# Patient Record
Sex: Male | Born: 1985 | Race: White | Hispanic: No | Marital: Single | Smoking: Current every day smoker
Health system: Southern US, Community
[De-identification: ages and names within clinical notes are randomized; demographics above are authoritative.]

## PROBLEM LIST (undated history)

## (undated) DIAGNOSIS — I10 Essential (primary) hypertension: Secondary | ICD-10-CM

## (undated) DIAGNOSIS — B1920 Unspecified viral hepatitis C without hepatic coma: Secondary | ICD-10-CM

## (undated) HISTORY — PX: APPENDECTOMY: SHX54

---

## 2008-07-29 ENCOUNTER — Emergency Department (HOSPITAL_COMMUNITY): Admission: EM | Admit: 2008-07-29 | Discharge: 2008-07-29 | Payer: Self-pay | Admitting: Family Medicine

## 2008-08-09 ENCOUNTER — Ambulatory Visit: Payer: Self-pay | Admitting: Family Medicine

## 2008-08-09 DIAGNOSIS — M25519 Pain in unspecified shoulder: Secondary | ICD-10-CM | POA: Insufficient documentation

## 2008-08-11 ENCOUNTER — Encounter: Payer: Self-pay | Admitting: Family Medicine

## 2008-08-17 ENCOUNTER — Telehealth: Payer: Self-pay | Admitting: Family Medicine

## 2008-08-25 ENCOUNTER — Telehealth: Payer: Self-pay | Admitting: Family Medicine

## 2008-09-01 ENCOUNTER — Encounter: Payer: Self-pay | Admitting: Family Medicine

## 2008-09-06 ENCOUNTER — Telehealth: Payer: Self-pay | Admitting: Family Medicine

## 2008-09-09 ENCOUNTER — Telehealth: Payer: Self-pay | Admitting: Family Medicine

## 2008-09-09 ENCOUNTER — Encounter: Payer: Self-pay | Admitting: Family Medicine

## 2008-09-26 ENCOUNTER — Telehealth: Payer: Self-pay | Admitting: Family Medicine

## 2008-10-11 ENCOUNTER — Telehealth: Payer: Self-pay | Admitting: Family Medicine

## 2008-10-21 ENCOUNTER — Telehealth: Payer: Self-pay | Admitting: Family Medicine

## 2008-10-27 ENCOUNTER — Encounter: Payer: Self-pay | Admitting: Family Medicine

## 2011-04-08 ENCOUNTER — Emergency Department (HOSPITAL_COMMUNITY)
Admission: EM | Admit: 2011-04-08 | Discharge: 2011-04-09 | Disposition: A | Discharge status: Home / Self Care | Payer: Self-pay | Attending: Emergency Medicine | Admitting: Emergency Medicine

## 2011-04-08 ENCOUNTER — Emergency Department (HOSPITAL_COMMUNITY): Payer: Self-pay

## 2011-04-08 DIAGNOSIS — Z79899 Other long term (current) drug therapy: Secondary | ICD-10-CM | POA: Insufficient documentation

## 2011-04-08 DIAGNOSIS — IMO0002 Reserved for concepts with insufficient information to code with codable children: Secondary | ICD-10-CM | POA: Insufficient documentation

## 2012-11-15 ENCOUNTER — Emergency Department (HOSPITAL_COMMUNITY): Payer: Self-pay

## 2012-11-15 ENCOUNTER — Encounter (HOSPITAL_COMMUNITY): Payer: Self-pay | Admitting: Internal Medicine

## 2012-11-15 ENCOUNTER — Inpatient Hospital Stay (HOSPITAL_COMMUNITY)
Admission: EM | Admit: 2012-11-15 | Discharge: 2012-11-24 | DRG: 917 | Disposition: A | Discharge status: Home / Self Care | Payer: MEDICAID | Attending: Internal Medicine | Admitting: Internal Medicine

## 2012-11-15 DIAGNOSIS — J9690 Respiratory failure, unspecified, unspecified whether with hypoxia or hypercapnia: Secondary | ICD-10-CM

## 2012-11-15 DIAGNOSIS — M67919 Unspecified disorder of synovium and tendon, unspecified shoulder: Secondary | ICD-10-CM | POA: Diagnosis present

## 2012-11-15 DIAGNOSIS — I498 Other specified cardiac arrhythmias: Secondary | ICD-10-CM | POA: Diagnosis present

## 2012-11-15 DIAGNOSIS — I214 Non-ST elevation (NSTEMI) myocardial infarction: Secondary | ICD-10-CM | POA: Diagnosis present

## 2012-11-15 DIAGNOSIS — G929 Unspecified toxic encephalopathy: Secondary | ICD-10-CM | POA: Diagnosis present

## 2012-11-15 DIAGNOSIS — I420 Dilated cardiomyopathy: Secondary | ICD-10-CM | POA: Diagnosis not present

## 2012-11-15 DIAGNOSIS — J9600 Acute respiratory failure, unspecified whether with hypoxia or hypercapnia: Secondary | ICD-10-CM

## 2012-11-15 DIAGNOSIS — M719 Bursopathy, unspecified: Secondary | ICD-10-CM | POA: Diagnosis present

## 2012-11-15 DIAGNOSIS — T401X4A Poisoning by heroin, undetermined, initial encounter: Principal | ICD-10-CM | POA: Diagnosis present

## 2012-11-15 DIAGNOSIS — I5020 Unspecified systolic (congestive) heart failure: Secondary | ICD-10-CM | POA: Diagnosis present

## 2012-11-15 DIAGNOSIS — I5181 Takotsubo syndrome: Secondary | ICD-10-CM | POA: Diagnosis present

## 2012-11-15 DIAGNOSIS — E870 Hyperosmolality and hypernatremia: Secondary | ICD-10-CM | POA: Diagnosis present

## 2012-11-15 DIAGNOSIS — F19939 Other psychoactive substance use, unspecified with withdrawal, unspecified: Secondary | ICD-10-CM | POA: Diagnosis not present

## 2012-11-15 DIAGNOSIS — R34 Anuria and oliguria: Secondary | ICD-10-CM | POA: Diagnosis not present

## 2012-11-15 DIAGNOSIS — T50901A Poisoning by unspecified drugs, medicaments and biological substances, accidental (unintentional), initial encounter: Secondary | ICD-10-CM

## 2012-11-15 DIAGNOSIS — E875 Hyperkalemia: Secondary | ICD-10-CM | POA: Diagnosis present

## 2012-11-15 DIAGNOSIS — R5381 Other malaise: Secondary | ICD-10-CM | POA: Diagnosis not present

## 2012-11-15 DIAGNOSIS — G92 Toxic encephalopathy: Secondary | ICD-10-CM | POA: Diagnosis present

## 2012-11-15 DIAGNOSIS — E876 Hypokalemia: Secondary | ICD-10-CM | POA: Diagnosis not present

## 2012-11-15 DIAGNOSIS — G931 Anoxic brain damage, not elsewhere classified: Secondary | ICD-10-CM | POA: Diagnosis present

## 2012-11-15 DIAGNOSIS — B1920 Unspecified viral hepatitis C without hepatic coma: Secondary | ICD-10-CM | POA: Diagnosis present

## 2012-11-15 DIAGNOSIS — F1120 Opioid dependence, uncomplicated: Secondary | ICD-10-CM | POA: Diagnosis present

## 2012-11-15 DIAGNOSIS — N179 Acute kidney failure, unspecified: Secondary | ICD-10-CM | POA: Diagnosis present

## 2012-11-15 DIAGNOSIS — F17200 Nicotine dependence, unspecified, uncomplicated: Secondary | ICD-10-CM | POA: Diagnosis present

## 2012-11-15 DIAGNOSIS — T401X1A Poisoning by heroin, accidental (unintentional), initial encounter: Secondary | ICD-10-CM | POA: Diagnosis present

## 2012-11-15 DIAGNOSIS — R4182 Altered mental status, unspecified: Secondary | ICD-10-CM

## 2012-11-15 HISTORY — DX: Unspecified viral hepatitis C without hepatic coma: B19.20

## 2012-11-15 LAB — HEPATIC FUNCTION PANEL
ALT: 18 U/L (ref 0–53)
AST: 33 U/L (ref 0–37)
Albumin: 3.9 g/dL (ref 3.5–5.2)
Alkaline Phosphatase: 85 U/L (ref 39–117)
Bilirubin, Direct: 0.1 mg/dL (ref 0.0–0.3)
Indirect Bilirubin: 0.1 mg/dL — ABNORMAL LOW (ref 0.3–0.9)
Total Bilirubin: 0.2 mg/dL — ABNORMAL LOW (ref 0.3–1.2)
Total Protein: 7.3 g/dL (ref 6.0–8.3)

## 2012-11-15 LAB — CBC WITH DIFFERENTIAL/PLATELET
Basophils Absolute: 0 10*3/uL (ref 0.0–0.1)
Basophils Relative: 0 % (ref 0–1)
Eosinophils Absolute: 0 10*3/uL (ref 0.0–0.7)
Hemoglobin: 15.3 g/dL (ref 13.0–17.0)
MCH: 32.6 pg (ref 26.0–34.0)
MCHC: 34.5 g/dL (ref 30.0–36.0)
Monocytes Absolute: 1.3 10*3/uL — ABNORMAL HIGH (ref 0.1–1.0)
Monocytes Relative: 7 % (ref 3–12)
Neutro Abs: 15.3 10*3/uL — ABNORMAL HIGH (ref 1.7–7.7)
Neutrophils Relative %: 84 % — ABNORMAL HIGH (ref 43–77)
RDW: 11.9 % (ref 11.5–15.5)

## 2012-11-15 LAB — CREATININE, SERUM
Creatinine, Ser: 1.18 mg/dL (ref 0.50–1.35)
GFR calc non Af Amer: 83 mL/min — ABNORMAL LOW (ref >90)

## 2012-11-15 LAB — CBC
Hemoglobin: 14.6 g/dL (ref 13.0–17.0)
RBC: 4.64 MIL/uL (ref 4.22–5.81)

## 2012-11-15 LAB — URINALYSIS, ROUTINE W REFLEX MICROSCOPIC
Bilirubin Urine: NEGATIVE
Glucose, UA: 250 mg/dL — AB
Ketones, ur: NEGATIVE mg/dL
Leukocytes, UA: NEGATIVE
Nitrite: NEGATIVE
Protein, ur: NEGATIVE mg/dL
Specific Gravity, Urine: 1.018 (ref 1.005–1.030)
Urobilinogen, UA: 0.2 mg/dL (ref 0.0–1.0)
pH: 5.5 (ref 5.0–8.0)

## 2012-11-15 LAB — BASIC METABOLIC PANEL
BUN: 21 mg/dL (ref 6–23)
Creatinine, Ser: 1.36 mg/dL — ABNORMAL HIGH (ref 0.50–1.35)
GFR calc Af Amer: 81 mL/min — ABNORMAL LOW (ref >90)
GFR calc non Af Amer: 70 mL/min — ABNORMAL LOW (ref >90)
Glucose, Bld: 199 mg/dL — ABNORMAL HIGH (ref 70–99)

## 2012-11-15 LAB — POCT I-STAT 3, ART BLOOD GAS (G3+)
Acid-base deficit: 5 mmol/L — ABNORMAL HIGH (ref 0.0–2.0)
Patient temperature: 98.6
pO2, Arterial: 89 mmHg (ref 80.0–100.0)

## 2012-11-15 LAB — RAPID URINE DRUG SCREEN, HOSP PERFORMED
Amphetamines: NOT DETECTED
Barbiturates: NOT DETECTED
Benzodiazepines: NOT DETECTED
Cocaine: NOT DETECTED
Opiates: POSITIVE — AB
Tetrahydrocannabinol: NOT DETECTED

## 2012-11-15 LAB — MRSA PCR SCREENING: MRSA by PCR: NEGATIVE

## 2012-11-15 LAB — CK: Total CK: 852 U/L — ABNORMAL HIGH (ref 7–232)

## 2012-11-15 LAB — URINE MICROSCOPIC-ADD ON

## 2012-11-15 LAB — SALICYLATE LEVEL: Salicylate Lvl: <2 mg/dL — ABNORMAL LOW (ref 2.8–20.0)

## 2012-11-15 LAB — ETHANOL: Alcohol, Ethyl (B): <11 mg/dL (ref 0–11)

## 2012-11-15 LAB — OSMOLALITY: Osmolality: 291 mOsm/kg (ref 275–300)

## 2012-11-15 LAB — TROPONIN I: Troponin I: 0.63 ng/mL

## 2012-11-15 MED ORDER — ROCURONIUM BROMIDE 50 MG/5ML IV SOLN
80.0000 mg | Freq: Once | INTRAVENOUS | Status: AC
  Administered 2012-11-15: 80 mg via INTRAVENOUS
  Filled 2012-11-15: qty 8

## 2012-11-15 MED ORDER — SODIUM POLYSTYRENE SULFONATE 15 GM/60ML PO SUSP
30.0000 g | Freq: Once | ORAL | Status: AC
  Administered 2012-11-15: 30 g
  Filled 2012-11-15: qty 120

## 2012-11-15 MED ORDER — LIDOCAINE HCL (CARDIAC) 20 MG/ML IV SOLN
INTRAVENOUS | Status: AC
  Filled 2012-11-15: qty 5

## 2012-11-15 MED ORDER — DEXTROSE 50 % IV SOLN
1.0000 | Freq: Once | INTRAVENOUS | Status: AC
  Administered 2012-11-15: 50 mL via INTRAVENOUS
  Filled 2012-11-15: qty 50

## 2012-11-15 MED ORDER — ETOMIDATE 2 MG/ML IV SOLN
INTRAVENOUS | Status: AC | PRN
  Administered 2012-11-15: 20 mg via INTRAVENOUS

## 2012-11-15 MED ORDER — SUCCINYLCHOLINE CHLORIDE 20 MG/ML IJ SOLN
INTRAMUSCULAR | Status: AC
  Filled 2012-11-15: qty 1

## 2012-11-15 MED ORDER — SODIUM CHLORIDE 0.9 % IV SOLN
10.0000 ug/h | INTRAVENOUS | Status: DC
  Administered 2012-11-15: 100 ug/h via INTRAVENOUS
  Filled 2012-11-15 (×2): qty 50

## 2012-11-15 MED ORDER — SODIUM CHLORIDE 0.9 % IV SOLN: 250.0000 mL | INTRAVENOUS | Status: DC | PRN

## 2012-11-15 MED ORDER — LORAZEPAM 2 MG/ML IJ SOLN: 2.0000 mg | Freq: Once | INTRAMUSCULAR | Status: DC

## 2012-11-15 MED ORDER — SODIUM CHLORIDE 0.9 % IV SOLN: Freq: Once | INTRAVENOUS | Status: DC

## 2012-11-15 MED ORDER — ADENOSINE 6 MG/2ML IV SOLN
12.0000 mg | Freq: Once | INTRAVENOUS | Status: AC
  Administered 2012-11-15: 12 mg via INTRAVENOUS

## 2012-11-15 MED ORDER — PIPERACILLIN-TAZOBACTAM 3.375 G IVPB
3.3750 g | Freq: Three times a day (TID) | INTRAVENOUS | Status: DC
  Administered 2012-11-16 – 2012-11-19 (×10): 3.375 g via INTRAVENOUS
  Filled 2012-11-15 (×12): qty 50

## 2012-11-15 MED ORDER — SODIUM CHLORIDE 0.9 % IV SOLN
2.0000 mg/h | INTRAVENOUS | Status: DC
  Administered 2012-11-15: 2 mg/h via INTRAVENOUS
  Filled 2012-11-15: qty 10

## 2012-11-15 MED ORDER — SODIUM CHLORIDE 0.9 % IV BOLUS (SEPSIS)
1000.0000 mL | Freq: Once | INTRAVENOUS | Status: AC
  Administered 2012-11-15: 1000 mL via INTRAVENOUS

## 2012-11-15 MED ORDER — ROCURONIUM BROMIDE 50 MG/5ML IV SOLN
INTRAVENOUS | Status: AC | PRN
  Administered 2012-11-15: 80 mg via INTRAVENOUS

## 2012-11-15 MED ORDER — HEPARIN SODIUM (PORCINE) 5000 UNIT/ML IJ SOLN
5000.0000 [IU] | Freq: Three times a day (TID) | INTRAMUSCULAR | Status: DC
  Administered 2012-11-15 – 2012-11-17 (×5): 5000 [IU] via SUBCUTANEOUS
  Filled 2012-11-15 (×8): qty 1

## 2012-11-15 MED ORDER — SODIUM CHLORIDE 0.9 % IV SOLN
1.0000 g | Freq: Once | INTRAVENOUS | Status: AC
  Administered 2012-11-15: 1 g via INTRAVENOUS
  Filled 2012-11-15: qty 10

## 2012-11-15 MED ORDER — PANTOPRAZOLE SODIUM 40 MG IV SOLR
40.0000 mg | Freq: Every day | INTRAVENOUS | Status: DC
  Administered 2012-11-15 – 2012-11-18 (×4): 40 mg via INTRAVENOUS
  Filled 2012-11-15 (×5): qty 40

## 2012-11-15 MED ORDER — ROCURONIUM BROMIDE 50 MG/5ML IV SOLN
INTRAVENOUS | Status: AC
  Filled 2012-11-15: qty 2

## 2012-11-15 MED ORDER — INSULIN REGULAR HUMAN 100 UNIT/ML IJ SOLN: 10.0000 [IU] | Freq: Once | INTRAMUSCULAR | Status: DC

## 2012-11-15 MED ORDER — SODIUM CHLORIDE 0.9 % IV SOLN
INTRAVENOUS | Status: AC
  Administered 2012-11-15 – 2012-11-16 (×2): via INTRAVENOUS

## 2012-11-15 MED ORDER — PIPERACILLIN-TAZOBACTAM 3.375 G IVPB 30 MIN
3.3750 g | INTRAVENOUS | Status: AC
  Administered 2012-11-15: 3.375 g via INTRAVENOUS
  Filled 2012-11-15: qty 50

## 2012-11-15 MED ORDER — ETOMIDATE 2 MG/ML IV SOLN
INTRAVENOUS | Status: AC
  Filled 2012-11-15: qty 20

## 2012-11-15 MED ORDER — MIDAZOLAM HCL 2 MG/2ML IJ SOLN: 2.0000 mg | INTRAMUSCULAR | Status: DC | PRN

## 2012-11-15 MED ORDER — ETOMIDATE 2 MG/ML IV SOLN
20.0000 mg | Freq: Once | INTRAVENOUS | Status: AC
  Administered 2012-11-15: 20 mg via INTRAVENOUS

## 2012-11-15 MED ORDER — VANCOMYCIN HCL IN DEXTROSE 1-5 GM/200ML-% IV SOLN
1000.0000 mg | Freq: Two times a day (BID) | INTRAVENOUS | Status: DC
  Administered 2012-11-16 – 2012-11-17 (×4): 1000 mg via INTRAVENOUS
  Filled 2012-11-15 (×5): qty 200

## 2012-11-15 MED ORDER — INSULIN REGULAR HUMAN 100 UNIT/ML IJ SOLN
6.0000 [IU] | Freq: Once | INTRAMUSCULAR | Status: DC
  Filled 2012-11-15: qty 0.06

## 2012-11-15 MED ORDER — INSULIN ASPART 100 UNIT/ML ~~LOC~~ SOLN
6.0000 [IU] | Freq: Once | SUBCUTANEOUS | Status: AC
  Administered 2012-11-15: 6 [IU] via INTRAVENOUS
  Filled 2012-11-15: qty 1

## 2012-11-15 MED ORDER — SODIUM BICARBONATE 8.4 % IV SOLN
50.0000 meq | Freq: Once | INTRAVENOUS | Status: AC
  Administered 2012-11-15: 50 meq via INTRAVENOUS
  Filled 2012-11-15: qty 50

## 2012-11-15 MED ORDER — SODIUM CHLORIDE 0.9 % IV SOLN
2.0000 mg/h | INTRAVENOUS | Status: DC
  Administered 2012-11-15 (×3): 2 mg/h via INTRAVENOUS
  Filled 2012-11-15 (×2): qty 10

## 2012-11-15 MED ORDER — ADENOSINE 6 MG/2ML IV SOLN
INTRAVENOUS | Status: AC
  Administered 2012-11-15: 6 mg
  Filled 2012-11-15: qty 6

## 2012-11-15 NOTE — Code Documentation (Signed)
Radiology in room for Chest x ray

## 2012-11-15 NOTE — ED Notes (Signed)
Resting sedated on vent, breathing with vent, VSS, HR improved down to 163 from 173, 3rd IV site established, foley placed, no changes.

## 2012-11-15 NOTE — Progress Notes (Signed)
ANTIBIOTIC CONSULT NOTE - INITIAL  Pharmacy Consult for Vancomycin and Zosyn Indication: Empiric for leukocytosis and fever  Not on File  Patient Measurements: Height: 6' (182.9 cm) Weight: 179 lb 3.7 oz (81.3 kg) IBW/kg (Calculated) : 77.6  Vital Signs: Temp: 100.9 F (38.3 C) (02/23 2115) Temp src: Rectal (02/23 2002) BP: 123/88 mmHg (02/23 2115) Pulse Rate: 139 (02/23 2115) Intake/Output from previous day:   Intake/Output from this shift: Total I/O In: 1000 [I.V.:1000] Out: -   Labs:  Recent Labs  11/15/12 1839 11/15/12 2125  WBC 18.3* 17.1*  HGB 15.3 14.6  PLT 194 145*  CREATININE 1.36*  --    Estimated Creatinine Clearance: 88.8 ml/min (by C-G formula based on Cr of 1.36). No results found for this basename: VANCOTROUGH, VANCOPEAK, VANCORANDOM, GENTTROUGH, GENTPEAK, GENTRANDOM, TOBRATROUGH, TOBRAPEAK, TOBRARND, AMIKACINPEAK, AMIKACINTROU, AMIKACIN,  in the last 72 hours   Microbiology: No results found for this or any previous visit (from the past 720 hour(s)).  Medical History: Past Medical History  Diagnosis Date  . Hepatitis C     Medications:  Med Rec Pending  Assessment: 27 y.o. male presents with heroin overdose - found unresponsive, respiratory failure requiring intubation in ED. Pt with leukocytosis and fever. LA 4.6. To begin empiric Vancomycin and Zosyn. Allergies unknown. Creat 1.35 - est CrCl 85 ml/min. Blood cultures pending.  Goal of Therapy:  Vancomycin trough level 10-15 mcg/ml  Plan:  1. Zosyn 3.375gm IV q8h. First dose over 30 minutes then subsequent doses over 4 hours. 2. Vancomycin 1 gm IV q12h. First dose now. 3. Will f/u microbiological data, renal function, trough at Css  Christoper Fabian, PharmD, BCPS Clinical pharmacist, pager [phone] 11/15/2012,10:03 PM

## 2012-11-15 NOTE — ED Notes (Signed)
Foley attempted by this EMT and Wes, RN with no success.

## 2012-11-15 NOTE — ED Notes (Signed)
No changes. Kayexalate given, family at [facility], VSS/improved, RT in to suction ETT.

## 2012-11-15 NOTE — H&P (Signed)
PULMONARY  / CRITICAL CARE MEDICINE  Name: Austin Nielsen MRN: [id number] DOB: [birth year]    ADMISSION DATE:  11/15/2012 CHIEF COMPLAINT:  Heroin overdose and respiratory failure.  BRIEF PATIENT DESCRIPTION:  27 years old male with PMH relevant for hepatitis C and drug abuse. Presents brought by EMS after being found unresponsive.  SIGNIFICANT EVENTS / STUDIES:  - Normal head CT - Normal chest X ray  LINES / TUBES: - 3 peripheral IV's - Consent for central line taken in case is needed.  CULTURES: - Will order (11/15/12)  ANTIBIOTICS: - Zosyn (11/15/12) - Vancomycin (11/15/12)  HISTORY OF PRESENT ILLNESS:   27 years old male with PMH relevant for hepatitis C and drug abuse. Presents brought by EMS after being found unresponsive. He was last seen doing well today at 10:00am. He was found at about 6:30 pm. At admission he was unresponsive and was intubated for airway protection. He had pin point pupils, had some posturing movements as per the ED physicians and was tachycardic and hypertensive. There are multiple needle marks in his right arm. At the time of my exam the patient is intubated, sedated with versed, in sinus tachycardia in the 150's and slightly hypertensive. As per the nurse in the room he was moving all 4 extremities and trying to reach for the ETT with his left hand.   PAST MEDICAL HISTORY :  Past Medical History  Diagnosis Date  . Hepatitis C    No past surgical history on file. Prior to Admission medications   Not on File   Not on File  FAMILY HISTORY:  No family history on file. SOCIAL HISTORY: Current smoker of about a pack per day  REVIEW OF SYSTEMS:  Unable to obtain.  SUBJECTIVE:   VITAL SIGNS: Temp:  [100.2 F (37.9 C)-100.8 F (38.2 C)] 100.8 F (38.2 C) (02/23 2002) Pulse Rate:  [161-180] 161 (02/23 2001) Resp:  [13-60] 22 (02/23 2001) BP: (126-226)/(87-209) 126/87 mmHg (02/23 2001) SpO2:  [96 %-100 %] 100 % (02/23 2001) FiO2 (%):  [40  %] 40 % (02/23 1900) HEMODYNAMICS:   VENTILATOR SETTINGS: Vent Mode:  [-] PRVC FiO2 (%):  [40 %] 40 % Set Rate:  [20 bmp] 20 bmp PEEP:  [5 cmH20] 5 cmH20 Plateau Pressure:  [16 cmH20] 16 cmH20 INTAKE / OUTPUT: Intake/Output     02/23 0701 - 02/24 0700   I.V. 1000   Total Intake 1000   Net +1000         PHYSICAL EXAMINATION: General: Intubated, sedated, no apparent distress. Eyes: Anicteric sclerae. ENT: Oropharynx clear. Dry mucous membranes. ETT in place Lymph: No cervical, supraclavicular, or axillary lymphadenopathy. Heart: Normal S1, S2. Tachycardic. No murmurs, rubs, or gallops appreciated. No bruits, equal pulses. Lungs: Normal excursion, no dullness to percussion. Good air movement bilaterally, without wheezes or crackles. Normal upper airway sounds without evidence of stridor. Abdomen: Abdomen soft, non-tender and not distended, normoactive bowel sounds. No hepatosplenomegaly or masses. Musculoskeletal: No clubbing or synovitis. Swollen right hand but warm with good capillary filling. Normal distal pulses in all 4 extremities.  Skin: No rashes. Needle marks in the right antecubital area. Neuro: Unresponsive to verbal and painful stimuli. Slight movement of the left hand.   LABS:  Recent Labs Lab 11/15/12 1839 11/15/12 1903  HGB 15.3  --   WBC 18.3*  --   PLT 194  --   NA 134*  --   K 6.4*  --   CL 98  --  CO2 22  --   GLUCOSE 199*  --   BUN 21  --   CREATININE 1.36*  --   CALCIUM 8.5  --   MG 1.7  --   AST 33  --   ALT 18  --   ALKPHOS 85  --   BILITOT 0.2*  --   PROT 7.3  --   ALBUMIN 3.9  --   LATICACIDVEN 4.6*  --   TROPONINI 0.63*  --   PHART  --  7.313*  PCO2ART  --  40.8  PO2ART  --  89.0   No results found for this basename: GLUCAP,  in the last 168 hours  CXR:  - No acute infiltrates. ETT in adequate position.  ASSESSMENT / PLAN: 27 years old male with PMH relevant for hepatitis C and IV drug abuse (heroin). Presents brought by EMS  after being found unresponsive. Intubated for airway protection. Urine toxicology screen positive for opiates. CT scan of the head with no acute findings. Slightly elevated troponin. Hyperkalemia (treated), Acute renal failure (creat: 1.36) with mildly elevated CPK (852).   PULMONARY A: 1) Acute respiratory failure due to inability to protect his airway 2) Heroin overdose P:   - Admit to ICU - Mechanical ventilation   - PRVC, Vt: 8cc/kg, PEEP: 5, RR: 16, FiO2: 100% and adjust to keep O2 sat > 96% - VAP prevention order set - SBT in am  CARDIOVASCULAR A:  1) Sinus tachycardia 2) Hypertension  3) Elevated troponin P:  - Will monitor in ICU - Consider IV metoprolol if persistent tachycardia / hypertension after IV resuscitation. - Serial troponin - Echocardiogram in am  RENAL A:   1) Acute renal failure, likely pre renal, mildly elevated CPK. 2) Hyperkalemia P:  - NS 0.9% 1000 cc bolus (3rd) - Will continue NS 0.9% at 150 cc/hr for 12 hrs and then reassess  - Will repeat BMP at 2:00 am to check for resolution of hyperkalemia - Will repeat CPK at 2:00 am  GASTROINTESTINAL A:   1) No issues P:   - GI prophylaxis with protonix  HEMATOLOGIC A:   1) No issues P:  - Will follow CBC  INFECTIOUS A:   - No evidence of acute infection or aspiration.  - He does have elevated WBC and fever of 38.3 P:   - Will get blood cultures - Monitor fever curve - Echocardiogram in am - Will start Zosyn and vancomycin given elevated WBC, fever and current IV drug use. Will follow cultures and echo and modify according to results.  - Will get HIV antibodies  ENDOCRINE A:   1) No issues   NEUROLOGIC A:   1) Heroin overdose 2) Possible anoxic brain injury. No acute findings on head CT. P:   - Intermittent sedation with versed - Consider MRI in am if persistent unresponsiveness.    I have personally obtained a history, examined the patient, evaluated laboratory and imaging  results, formulated the assessment and plan and placed orders. CRITICAL CARE: The patient is critically ill with multiple organ systems failure and requires high complexity decision making for assessment and support, frequent evaluation and titration of therapies, application of advanced monitoring technologies and extensive interpretation of multiple databases. Critical Care Time devoted to patient care services described in this note is 60 minutes.   Overton Mam MD Pulmonary and Critical Care Medicine [facility] Pager: [phone]  11/15/2012, 8:53 PM

## 2012-11-15 NOTE — Progress Notes (Signed)
Chaplain found parents at bedside with pt in Trauma C. I provided emotional and spiritual support and prayed with them at request of pt's dad. Patient overdosed, possibly on heroin. He has history of drug abuse. Pt's parents are very disappointed because they thought until today that he had been "clean" from drugs for several months. Pt's dad expressed appreciation for chaplain's support. Pt will be going to 2100.

## 2012-11-15 NOTE — Code Documentation (Signed)
Pt intubated by Dr. Park Pope with a 7.5 ett @23  teeth.  Bilateral chest expansion, (+) colormetric change, Bilateral breath sounds.

## 2012-11-15 NOTE — ED Provider Notes (Signed)
History     CSN: [id number]  Arrival date & time 11/15/12  4098   First MD Initiated Contact with Patient 11/15/12 1837      Chief Complaint  Patient presents with  . Drug Overdose    (Consider location/radiation/quality/duration/timing/severity/associated sxs/prior treatment) Patient is a 27 y.o. male presenting with altered mental status. The history is provided by the EMS personnel. The history is limited by the absence of a caregiver and the condition of the patient. No language interpreter was used.  Altered Mental Status This is a new problem. The current episode started today. The problem occurs constantly. The problem has been unchanged. Nothing aggravates the symptoms. Treatments tried: dextrose, narcan. The treatment provided no relief.    Past Medical History  Diagnosis Date  . Hepatitis C     No past surgical history on file.  No family history on file.  History  Substance Use Topics  . Smoking status: Not on file  . Smokeless tobacco: Not on file  . Alcohol Use: Not on file      Review of Systems  Unable to perform ROS: Mental status change  Psychiatric/Behavioral: Positive for altered mental status.    Allergies  Review of patient's allergies indicates not on file.  Home Medications  No current outpatient prescriptions on file.  BP 99/71  Pulse 110  Temp(Src) 101.3 F (38.5 C) (Rectal)  Resp 16  Ht 6' (1.829 m)  Wt 179 lb 3.7 oz (81.3 kg)  BMI 24.3 kg/m2  SpO2 100%  Physical Exam  Constitutional: He is oriented to person, place, and time. He appears well-developed and well-nourished. He appears distressed.  HENT:  Head: Normocephalic and atraumatic.  Mouth/Throat: No oropharyngeal exudate.  Eyes:  2mm sluggish   Neck: Normal range of motion. Neck supple.  Cardiovascular: Regular rhythm and normal heart sounds.  Tachycardia present.  Exam reveals no gallop and no friction rub.   No murmur heard. Pulmonary/Chest: Accessory muscle usage  present. Tachypnea noted. No respiratory distress. He has no decreased breath sounds. He has no wheezes. He has rhonchi. He has no rales.  Abdominal: Soft. Bowel sounds are normal. He exhibits no distension and no mass. There is no tenderness. There is no rebound and no guarding.  Musculoskeletal: Normal range of motion. He exhibits no edema and no tenderness.  Neurological: He is alert and oriented to person, place, and time. GCS eye subscore is 4. GCS verbal subscore is 1. GCS motor subscore is 4.  Extension of arms and legs, likely decerebrate posturing.   Skin: Skin is warm. He is diaphoretic.  Psychiatric: He has a normal mood and affect.    ED Course  Procedures (including critical care time)  Labs Reviewed  URINE RAPID DRUG SCREEN (HOSP PERFORMED) - Abnormal; Notable for the following:    Opiates POSITIVE (*)    All other components within normal limits  BASIC METABOLIC PANEL - Abnormal; Notable for the following:    Sodium 134 (*)    Potassium 6.4 (*)    Glucose, Bld 199 (*)    Creatinine, Ser 1.36 (*)    GFR calc non Af Amer 70 (*)    GFR calc Af Amer 81 (*)    All other components within normal limits  CBC WITH DIFFERENTIAL - Abnormal; Notable for the following:    WBC 18.3 (*)    Neutrophils Relative 84 (*)    Neutro Abs 15.3 (*)    Lymphocytes Relative 10 (*)    Monocytes  Absolute 1.3 (*)    All other components within normal limits  HEPATIC FUNCTION PANEL - Abnormal; Notable for the following:    Total Bilirubin 0.2 (*)    Indirect Bilirubin 0.1 (*)    All other components within normal limits  SALICYLATE LEVEL - Abnormal; Notable for the following:    Salicylate Lvl <2.0 (*)    All other components within normal limits  URINALYSIS, ROUTINE W REFLEX MICROSCOPIC - Abnormal; Notable for the following:    Glucose, UA 250 (*)    Hgb urine dipstick TRACE (*)    All other components within normal limits  TROPONIN I - Abnormal; Notable for the following:    Troponin I  0.63 (*)    All other components within normal limits  LACTIC ACID, PLASMA - Abnormal; Notable for the following:    Lactic Acid, Venous 4.6 (*)    All other components within normal limits  CK - Abnormal; Notable for the following:    Total CK 852 (*)    All other components within normal limits  CBC - Abnormal; Notable for the following:    WBC 17.1 (*)    Platelets 145 (*)    All other components within normal limits  CREATININE, SERUM - Abnormal; Notable for the following:    GFR calc non Af Amer 83 (*)    All other components within normal limits  URINE MICROSCOPIC-ADD ON - Abnormal; Notable for the following:    Squamous Epithelial / LPF FEW (*)    Bacteria, UA FEW (*)    Casts HYALINE CASTS (*)    All other components within normal limits  GLUCOSE, CAPILLARY - Abnormal; Notable for the following:    Glucose-Capillary 106 (*)    All other components within normal limits  POCT I-STAT 3, BLOOD GAS (G3+) - Abnormal; Notable for the following:    pH, Arterial 7.313 (*)    Acid-base deficit 5.0 (*)    All other components within normal limits  MRSA PCR SCREENING  CULTURE, BLOOD (ROUTINE X 2)  CULTURE, BLOOD (ROUTINE X 2)  ACETAMINOPHEN LEVEL  OSMOLALITY  ETHANOL  MAGNESIUM  BLOOD GAS, ARTERIAL  BLOOD GAS, ARTERIAL  CBC  BASIC METABOLIC PANEL  PHOSPHORUS  BASIC METABOLIC PANEL  TROPONIN I  TROPONIN I  HIV ANTIBODY (ROUTINE TESTING)  CK  TROPONIN I   Ct Head Wo Contrast  11/15/2012  *RADIOLOGY REPORT*  Clinical Data: Unresponsive.  Altered mental status.  CT HEAD WITHOUT CONTRAST  Technique:  Contiguous axial images were obtained from the base of the skull through the vertex without contrast.  Comparison: None.  Findings: The brain has a normal appearance without evidence of malformation, atrophy, old or acute infarction, mass lesion, hemorrhage, hydrocephalus or significant extra-axial collection. 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  No skull fracture.  There are some mucosal inflammatory changes of the sinuses, most notable on the right maxillary sinus.  IMPRESSION: Normal appearance of the brain.  Some sinus inflammation, particularly of the right maxillary sinus.   Original Report Authenticated By: Paulina Fusi, M.D.    Dg Chest Port 1 View  11/15/2012  *RADIOLOGY REPORT*  Clinical Data: Respiratory distress.  Status post intubation.  PORTABLE CHEST - 1 VIEW  Comparison: None  Findings: The cardiomediastinal silhouette is unremarkable. An endotracheal tube is identified with tip 4.5 cm above the carina. The lungs are clear. There is no evidence of focal airspace disease, pulmonary edema, suspicious  pulmonary nodule/mass, pleural effusion, or pneumothorax. No acute bony abnormalities are identified.  IMPRESSION: Endotracheal tube placement without evidence of active cardiopulmonary disease.   Original Report Authenticated By: Harmon Pier, M.D.      1. Altered mental status   2. Respiratory failure   3. Drug overdose       MDM  6:57 PM Pt is a 27 y.o. male with pertinent PMHX of hep C, drug abuse who presents with decreased LOC and concern for drug OD. Pt found in backseat of car, unresponsive, tachycardic, tachypnea, diaphoretic, with drug paraphernalia and needle sticks to arm. Upon arrival, pt with inc WOB, HR 170-180s, RR 34, BP on mechanical cuff 226/209, but thought to be unlikely.  Pt had vomitus around mouth, no questionable withdraw from pain, likely decerebrate posturing.   Given concern for airway protection, pt intubated.  No signs of trauma on exam.  Needle sticks at [employer].  Adenosine given (6mg , then 12mg ) for tachycardia, slowed to 160's and likely sinus.  Pt given 2L NS bolus.  Brief beside echo done prior to intubation which showed no ptx, pericardial effusion, hyperdynamic cardiac activity.    After CXR confirmed ETT placement, pt taken for CT head.  No acute findings on CT, but clinically there is  concern for anoxic brain injury likely from heroin or polysubstance OD.    I have spoken to pt's family for about 10 mins with critical care fellow and explained our concern for drug abuse and possible ischemic brain injury. Family reports they thought he had been clean until today when tissue found in pt's room with spots of blood.  In the past he has abused PO narcotics, cocaine, heroin.    K elevated w/o hemolysis.  Hyper K+ treatment initiated.  Cr, CK, trop also somewhat elevated.  Will continue IVF resuscitation. Pt will be taken to ICU.     1. Altered mental status   2. Respiratory failure   3. Drug overdose      Labs and imaging considered in decision making, reviewed by myself.  Imaging interpreted by radiology. Pt care discussed with my attending, Dr. Rhunette Croft.    Toy Cookey, MD 11/16/12 0157  I saw and evaluated the patient, reviewed the resident's note and I agree with the findings and plan.  CRITICAL CARE Performed by: Derwood Kaplan   Total critical care time: 60 minutes  Critical care time was exclusive of separately billable procedures and treating other patients.  Critical care was necessary to treat or prevent imminent or life-threatening deterioration.  Critical care was time spent personally by me on the following activities: development of treatment plan with patient and/or surrogate as well as nursing, discussions with consultants, evaluation of patient's response to treatment, examination of patient, obtaining history from patient or surrogate, ordering and performing treatments and interventions, ordering and review of laboratory studies, ordering and review of radiographic studies, pulse oximetry and re-evaluation of patient's condition.  INTUBATION Performed by: Dr. Toy Cookey - under Dr. Brandy Hale direct supervision  Required items: required blood products, implants, devices, and special equipment available Patient identity confirmed:  provided demographic data and hospital-assigned identification number Time out: Immediately prior to procedure a "time out" was called to verify the correct patient, procedure, equipment, support staff and site/side marked as required.  Indications: Airway protection  Intubation method: Direct Laryngoscopy   Preoxygenation: BVM  Sedatives: 20 mg Etomidate Paralytic: 80 mg Rocoronium  Tube Size: 7.5 cuffed  Post-procedure assessment: chest rise and ETCO2 monitor Breath  sounds: equal and absent over the epigastrium Tube secured with: ETT holder Chest x-ray interpreted by radiologist and me.  Chest x-ray findings: endotracheal tube in appropriate position  Patient tolerated the procedure well with no immediate complications.  Pt comes in with cc of AMS. Pt has hx of heroine abuse. Pt had unknown duration of down time. Came in tachycardic, tachypneic, slightly hypotensive. No JVD, we did a bedside US - showed no PTX and no Tamponade. Pt noted to have extension contractures. Possible anoxic brain injury. Pt also tachycardic in the 180s. Adenosine 6 mg, and then 12 mg given - shows sinus tachycardia. Family made aware of the diagnosis and the findings. CCM admission.   Diagnosis: Altered Mental Status Substance abuse  PSVT Hyperkalemia    Derwood Kaplan, MD 11/17/12 1610

## 2012-11-15 NOTE — ED Notes (Addendum)
Per [place] with a RR of 60. Spoon with residue noted beside pt as well as needles and fresh track marks to [facility] area. Ems placed a NPA and a NRB.  4mg  of Narcan IV no response to the med.  D50 administered no change as well.  Pt foaming at the mouth he does withdraw from pain.  Vomited x 1 coffee ground emesis.  Pt arrived to ER with No change in status but noted to be posturing.

## 2012-11-15 NOTE — ED Notes (Signed)
Pt moved L hand up towards shoulder, arm b/w rail, replaced back along side, sz pad placed on L rail to protect arm, midazolam bolus given, PCCM and family x3 now at [facility].

## 2012-11-16 ENCOUNTER — Encounter (HOSPITAL_COMMUNITY): Payer: Self-pay

## 2012-11-16 DIAGNOSIS — T50901A Poisoning by unspecified drugs, medicaments and biological substances, accidental (unintentional), initial encounter: Secondary | ICD-10-CM

## 2012-11-16 DIAGNOSIS — J9600 Acute respiratory failure, unspecified whether with hypoxia or hypercapnia: Secondary | ICD-10-CM

## 2012-11-16 LAB — BLOOD GAS, ARTERIAL
Acid-base deficit: 1.1 mmol/L (ref 0.0–2.0)
Drawn by: 252031
FIO2: 0.4 %
MECHVT: 620 mL
O2 Saturation: 99.1 %
RATE: 16 resp/min
TCO2: 23.6 mmol/L (ref 0–100)
pO2, Arterial: 153 mmHg — ABNORMAL HIGH (ref 80.0–100.0)

## 2012-11-16 LAB — BASIC METABOLIC PANEL
CO2: 25 mEq/L (ref 19–32)
CO2: 25 mEq/L (ref 19–32)
Calcium: 8.4 mg/dL (ref 8.4–10.5)
Calcium: 8.4 mg/dL (ref 8.4–10.5)
Chloride: 102 mEq/L (ref 96–112)
Chloride: 104 mEq/L (ref 96–112)
GFR calc Af Amer: >90 mL/min (ref >90)
Sodium: 135 mEq/L (ref 135–145)
Sodium: 137 mEq/L (ref 135–145)

## 2012-11-16 LAB — TROPONIN I
Troponin I: 0.94 ng/mL (ref <0.30)
Troponin I: 0.85 ng/mL (ref <0.30)
Troponin I: 3.02 ng/mL (ref <0.30)

## 2012-11-16 LAB — PHOSPHORUS: Phosphorus: 1.9 mg/dL — ABNORMAL LOW (ref 2.3–4.6)

## 2012-11-16 LAB — CBC
Platelets: 143 10*3/uL — ABNORMAL LOW (ref 150–400)
RBC: 4.41 MIL/uL (ref 4.22–5.81)
WBC: 14.5 10*3/uL — ABNORMAL HIGH (ref 4.0–10.5)

## 2012-11-16 MED ORDER — PROPOFOL 10 MG/ML IV EMUL
INTRAVENOUS | Status: AC
  Filled 2012-11-16: qty 100

## 2012-11-16 MED ORDER — FENTANYL CITRATE 0.05 MG/ML IJ SOLN
50.0000 ug | INTRAMUSCULAR | Status: DC | PRN
  Administered 2012-11-16 – 2012-11-18 (×11): 100 ug via INTRAVENOUS
  Filled 2012-11-16 (×11): qty 2

## 2012-11-16 MED ORDER — PROPOFOL 10 MG/ML IV EMUL
5.0000 ug/kg/min | INTRAVENOUS | Status: DC
  Administered 2012-11-16: 40 ug/kg/min via INTRAVENOUS
  Administered 2012-11-16: 15 ug/kg/min via INTRAVENOUS
  Administered 2012-11-16: 40 ug/kg/min via INTRAVENOUS
  Administered 2012-11-17: 50 ug/kg/min via INTRAVENOUS
  Administered 2012-11-17: 40 ug/kg/min via INTRAVENOUS
  Administered 2012-11-17: 60 ug/kg/min via INTRAVENOUS
  Administered 2012-11-17: 50 ug/kg/min via INTRAVENOUS
  Administered 2012-11-17: 60 ug/kg/min via INTRAVENOUS
  Administered 2012-11-17: 40 ug/kg/min via INTRAVENOUS
  Administered 2012-11-18: 35 ug/kg/min via INTRAVENOUS
  Administered 2012-11-18: 50 ug/kg/min via INTRAVENOUS
  Filled 2012-11-16 (×11): qty 100

## 2012-11-16 MED ORDER — ACETAMINOPHEN 160 MG/5ML PO SOLN
650.0000 mg | Freq: Four times a day (QID) | ORAL | Status: DC | PRN
  Administered 2012-11-16: 650 mg
  Filled 2012-11-16 (×3): qty 20.3

## 2012-11-16 MED ORDER — POTASSIUM PHOSPHATE DIBASIC 3 MMOLE/ML IV SOLN
20.0000 mmol | Freq: Once | INTRAVENOUS | Status: AC
  Administered 2012-11-16: 20 mmol via INTRAVENOUS
  Filled 2012-11-16: qty 6.67

## 2012-11-16 NOTE — Progress Notes (Signed)
Pts sedation was turned off once more to reevaluate neuro status. While off sedation pt slowly began to move all extremities however pt was unable to follow commands. Pt opened eyes to verbal stimuli and pupils become more responsive to light. Will continue to monitor pt. Will leave pt on fent and give versed intermit. Will continue to monitor pt.

## 2012-11-16 NOTE — Progress Notes (Signed)
Pts continuous sedation turned off per doctors order to allow for better assessment of pts neuro status. Pts neuro status was reassessed 1hr post discontinuation of fent. And vers. Pt pupils are +4 and sluggish to light on right side and 5+ and sluggish to light on the left. Upon vocal stimulation and sternal rub pt appears to have decorticate posturing. Second opinion was asked of charge nurse. Charge nurse agreed that pt seemed to be posturing. Md was notified on pt neurological status. Md gave verbal order to restart sedation if pt seems to be uncomfortable and continued posturing. Will continue to monitor pt.

## 2012-11-16 NOTE — Progress Notes (Addendum)
Chaplain visited patient in response to a referral from on-call Chaplain the previous night. Pt was brought in as a result of drug overdose. Pt was unconscious and intubated. Pt's mother and older sister were in the room during Chaplain's visit. Mother expressed feelings of anger and sadness. Sister was shocked and sad as well. Chaplain listened empathically to family members as they narrate stories about pt. Chaplain shared words of comfort, encouragement, hope and provided ministry of presence. Family thanked Chaplain for his visit and presence. Kelle Darting [phone]

## 2012-11-16 NOTE — Progress Notes (Signed)
UR Completed.  Semone Orlov Jane [phone] 11/16/2012  

## 2012-11-16 NOTE — Progress Notes (Signed)
Pt has hematoma on left temporal area. Small read abrasion present

## 2012-11-16 NOTE — H&P (Signed)
PULMONARY  / CRITICAL CARE MEDICINE  Name: Austin Nielsen MRN: [id number] DOB: 03/26/1986    ADMISSION DATE:  11/15/2012 CHIEF COMPLAINT:  Heroin overdose and respiratory failure.    BRIEF PATIENT DESCRIPTION:  HISTORY OF PRESENT ILLNESS:   27 years old male with PMH relevant for hepatitis C and drug abuse. Presents brought by EMS after being found unresponsive. He was last seen doing well today at 10:00am. He was found at about 6:30 pm. At admission he was unresponsive and was intubated for airway protection. He had pin point pupils, had some posturing movements as per the ED physicians and was tachycardic and hypertensive. There are multiple needle marks in his right arm. At the time of my exam the patient is intubated, sedated with versed, in sinus tachycardia in the 150's and slightly hypertensive. As per the nurse in the room he was moving all 4 extremities and trying to reach for the ETT with his left hand.   27 years old male with PMH relevant for hepatitis C and drug abuse. Presents brought by EMS after being found unresponsive.  SIGNIFICANT EVENTS / STUDIES:  - Normal head CT - Normal chest X ray  LINES / TUBES: - 3 peripheral IV's - Consent for central line taken in case is needed.  CULTURES: - Will order (11/15/12)  ANTIBIOTICS: - Zosyn (11/15/12) - Vancomycin (11/15/12)   EVENTS 11/15/2012 > admission .  SUBJECTIVE:  11/16/2012: He seems to be gradually waking up   VITAL SIGNS: Temp:  [98.7 F (37.1 C)-101.3 F (38.5 C)] 98.8 F (37.1 C) (02/24 1200) Pulse Rate:  [83-180] 103 (02/24 1200) Resp:  [13-60] 17 (02/24 1200) BP: (99-226)/(71-209) 119/83 mmHg (02/24 1200) SpO2:  [96 %-100 %] 100 % (02/24 1200) FiO2 (%):  [40 %] 40 % (02/24 1136) Weight:  [81.3 kg (179 lb 3.7 oz)] 81.3 kg (179 lb 3.7 oz) (02/23 2124) HEMODYNAMICS:   VENTILATOR SETTINGS: Vent Mode:  [-] PRVC FiO2 (%):  [40 %] 40 % Set Rate:  [16 bmp-20 bmp] 16 bmp Vt Set:  [520 mL-620 mL]  520 mL PEEP:  [5 cmH20] 5 cmH20 Plateau Pressure:  [16 cmH20-18 cmH20] 17 cmH20 INTAKE / OUTPUT: Intake/Output     02/23 0701 - 02/24 0700 02/24 0701 - 02/25 0700   I.V. (mL/kg) 2440.6 (30) 374.6 (4.6)   IV Piggyback 12.5 37.5   Total Intake(mL/kg) 2453.1 (30.2) 412.1 (5.1)   Urine (mL/kg/hr) 1225 940 (2)   Total Output 1225 940   Net +1228.1 -527.9          PHYSICAL EXAMINATION: General: Intubated, sedated, no apparent distress. Eyes: Anicteric sclerae. ENT: Oropharynx clear. Dry mucous membranes. ETT in place Lymph: No cervical, supraclavicular, or axillary lymphadenopathy. Heart: Normal S1, S2. Tachycardic. No murmurs, rubs, or gallops appreciated. No bruits, equal pulses. Lungs: Normal excursion, no dullness to percussion. Good air movement bilaterally, without wheezes or crackles. Normal upper airway sounds without evidence of stridor. Abdomen: Abdomen soft, non-tender and not distended, normoactive bowel sounds. No hepatosplenomegaly or masses. Musculoskeletal: No clubbing or synovitis. Swollen right hand but warm with good capillary filling. Normal distal pulses in all 4 extremities.  Skin: No rashes. Needle marks in the right antecubital area. Neuro: Moving all 4 extremities, pupils equal and reactive to light, opens eyes spontaneously, random movement, gag present, mildly agitated. Does not focus   LABS:  Recent Labs Lab 11/15/12 1839 11/15/12 1903 11/15/12 2125 11/16/12 0144 11/16/12 0415 11/16/12 0439 11/16/12 0730  HGB 15.3  --  14.6  --  13.9  --   --   WBC 18.3*  --  17.1*  --  14.5*  --   --   PLT 194  --  145*  --  143*  --   --   NA 134*  --   --  135 137  --   --   K 6.4*  --   --  4.3 4.5  --   --   CL 98  --   --  102 104  --   --   CO2 22  --   --  25 25  --   --   GLUCOSE 199*  --   --  136* 115*  --   --   BUN 21  --   --  21 20  --   --   CREATININE 1.36*  --  1.18 1.04 0.98  --   --   CALCIUM 8.5  --   --  8.4 8.4  --   --   MG 1.7  --   --    --   --   --   --   PHOS  --   --   --   --  1.9*  --   --   AST 33  --   --   --   --   --   --   ALT 18  --   --   --   --   --   --   ALKPHOS 85  --   --   --   --   --   --   BILITOT 0.2*  --   --   --   --   --   --   PROT 7.3  --   --   --   --   --   --   ALBUMIN 3.9  --   --   --   --   --   --   LATICACIDVEN 4.6*  --   --   --   --   --   --   TROPONINI 0.63*  --   --  0.94*  --   --  0.85*  PHART  --  7.313*  --   --   --  7.432  --   PCO2ART  --  40.8  --   --   --  34.5*  --   PO2ART  --  89.0  --   --   --  153.0*  --     Recent Labs Lab 11/15/12 2229  GLUCAP 106*    CXR:  - No acute infiltrates. ETT in adequate position.  ASSESSMENT / PLAN: 27 years old male with PMH relevant for hepatitis C and IV drug abuse (heroin). Presents brought by EMS after being found unresponsive. Intubated for airway protection. Urine toxicology screen positive for opiates. CT scan of the head with no acute findings. Slightly elevated troponin. Hyperkalemia (treated), Acute renal failure (creat: 1.36) with mildly elevated CPK (852).   PULMONARY A: 1) Acute respiratory failure due to inability to protect his airway 2) Heroin overdose - 11/16/2012: Does not meet spontaneous breathing trial criteria due to  agitation  P:   - - Mechanical ventilation   - PRVC, Vt: 8cc/kg, PEEP: 5, RR: 16, FiO2: 100% and adjust to keep O2 sat > 96% - VAP prevention order set - SBT in am if meets criteria  CARDIOVASCULAR   Recent Labs Lab 11/15/12 1839 11/16/12 0144 11/16/12 0730  TROPONINI 0.63* 0.94* 0.85*    A:  1) Sinus tachycardia 2) Hypertension  3) Elevated troponin P:  - Will monitor in ICU - Consider IV metoprolol if persistent tachycardia / hypertension after IV resuscitation. - Echocardiogram i done and results pending    RENAL  Recent Labs Lab 11/15/12 1839 11/15/12 2125 11/16/12 0144 11/16/12 0415  NA 134*  --  135 137  K 6.4*  --  4.3 4.5  CL 98  --  102 104  CO2  22  --  25 25  GLUCOSE 199*  --  136* 115*  BUN 21  --  21 20  CREATININE 1.36* 1.18 1.04 0.98  CALCIUM 8.5  --  8.4 8.4  MG 1.7  --   --   --   PHOS  --   --   --  1.9*    A:   1) Acute renal failure, likely pre renal, mildly elevated CPK.  11/16/2012 hypophosphatemia P:  - Replace phosphorus and trac electrolytes daily   GASTROINTESTINAL  Recent Labs Lab 11/15/12 1839  AST 33  ALT 18  ALKPHOS 85  BILITOT 0.2*  PROT 7.3  ALBUMIN 3.9    A:   1) No issues P:   - GI prophylaxis with protonix  HEMATOLOGIC  Recent Labs Lab 11/15/12 1839 11/15/12 2125 11/16/12 0415  HGB 15.3 14.6 13.9  HCT 44.4 43.9 41.0  WBC 18.3* 17.1* 14.5*  PLT 194 145* 143*    A:   1) No issues P:  - Will follow CBC  INFECTIOUS  Recent Labs Lab 11/15/12 1839  LATICACIDVEN 4.6*    A:   - No evidence of acute infection or aspiration.  - He does have elevated WBC and fever of 38.3 P:   - Will get blood cultures - Monitor fever curve - Echocardiogram in am - Will start Zosyn and vancomycin given elevated WBC, fever and current IV drug use. Will follow cultures and echo and modify according to results.  - Will get HIV antibodies  ENDOCRINE A:   1) No issues   NEUROLOGIC A:   1) Heroin overdose 2) Possible anoxic brain injury. No acute findings on head CT. - 11/16/2012: Improving mental status P:   - Hold off on MRI and neurology consultation - Use propofol for sedation    Global  - 11/16/2012: Mother and sister updated. Mother  significant spiritual distress due to loss of another sibling or drug problems patient relapsing  with the same since October 2013. Chaplain consult called    The patient is critically ill with multiple organ systems failure and requires high complexity decision making for assessment and support, frequent evaluation and titration of therapies, application of advanced monitoring technologies and extensive interpretation of multiple databases.    Critical Care Time devoted to patient care services described in this note is  31  Minutes.      Dr. Kalman Shan, M.D., [facility].C.P Pulmonary and Critical Care Medicine Staff Physician [facility] Pager: [phone], If no answer or between  15:00h - 7:00h: call [phone]  11/16/2012 1:02 PM

## 2012-11-16 NOTE — Progress Notes (Signed)
  Echocardiogram 2D Echocardiogram has been performed.  Georgian Co 11/16/2012, 10:08 AM

## 2012-11-16 NOTE — Progress Notes (Signed)
eLink Physician-Brief Progress Note Patient Name: Austin Nielsen DOB: 1986/05/25 MRN: [id number]  Date of Service  11/16/2012   HPI/Events of Note   Temp of 101.45F.  LFTs WNL  eICU Interventions  Plan: Order for PRN tylenol 650 mg via tube q6 hours as needed for temp greater or equal to 100.57F   Intervention Category Minor Interventions: Routine modifications to care plan (e.g. PRN medications for pain, fever)  DETERDING,ELIZABETH 11/16/2012, 12:06 AM

## 2012-11-16 NOTE — Clinical Social Work Psychosocial (Signed)
     Clinical Social Work Department BRIEF PSYCHOSOCIAL ASSESSMENT 11/16/2012  Patient:  Austin Nielsen, Austin Nielsen     Account Number:  [id number]     Admit date:  11/15/2012  Clinical Social Worker:  Margaree Mackintosh  Date/Time:  11/16/2012 12:00 M  Referred by:  RN  Date Referred:  11/16/2012 Referred for  Substance Abuse   Other Referral:   Interview type:  Other - See comment Other interview type:   RN & MD.    PSYCHOSOCIAL DATA Living Status:  FAMILY Admitted from facility:   Level of care:   Primary support name:  Diondre Pulis: [phone] Primary support relationship to patient:  PARENT Degree of support available:   RN reports mother has been at bedside.    CURRENT CONCERNS Current Concerns  Substance Abuse   Other Concerns:    SOCIAL WORK ASSESSMENT / PLAN Clinical Social Worker recieved referral from RN indicating pt with admission related to Herioin Overdose.  CSW staffed case with RN and MD during progression.  RN reports family has been at bedside and has experienced loss within their family unit.  CSW made referral to chaplain services to provide additional support/resources to family during this hospital admission.  CSW went to pt's room-no family currently at bedside-pt currently intubated and sedated. CSW to continue to follow and assist as needed.   Assessment/plan status:  Information/Referral to [facility] Other assessment/ plan:   Information/referral to community resources:   [employer].    PATIENTS/FAMILYS RESPONSE TO PLAN OF CARE: RN and MD appeared receptive to CSW intervention.  Pt and family currently unavailable.

## 2012-11-17 ENCOUNTER — Inpatient Hospital Stay (HOSPITAL_COMMUNITY): Payer: MEDICAID

## 2012-11-17 DIAGNOSIS — I420 Dilated cardiomyopathy: Secondary | ICD-10-CM | POA: Diagnosis not present

## 2012-11-17 DIAGNOSIS — T6591XS Toxic effect of unspecified substance, accidental (unintentional), sequela: Secondary | ICD-10-CM

## 2012-11-17 DIAGNOSIS — T50901S Poisoning by unspecified drugs, medicaments and biological substances, accidental (unintentional), sequela: Secondary | ICD-10-CM

## 2012-11-17 DIAGNOSIS — R4182 Altered mental status, unspecified: Secondary | ICD-10-CM

## 2012-11-17 DIAGNOSIS — I214 Non-ST elevation (NSTEMI) myocardial infarction: Secondary | ICD-10-CM | POA: Diagnosis present

## 2012-11-17 LAB — PHOSPHORUS: Phosphorus: 1.8 mg/dL — ABNORMAL LOW (ref 2.3–4.6)

## 2012-11-17 LAB — BASIC METABOLIC PANEL
CO2: 26 mEq/L (ref 19–32)
Chloride: 107 mEq/L (ref 96–112)
Creatinine, Ser: 0.86 mg/dL (ref 0.50–1.35)
GFR calc Af Amer: >90 mL/min (ref >90)
Potassium: 3.4 mEq/L — ABNORMAL LOW (ref 3.5–5.1)

## 2012-11-17 LAB — CK TOTAL AND CKMB (NOT AT ARMC)
CK, MB: 4 ng/mL (ref 0.3–4.0)
Relative Index: 0.6 (ref 0.0–2.5)

## 2012-11-17 LAB — MAGNESIUM: Magnesium: 2 mg/dL (ref 1.5–2.5)

## 2012-11-17 LAB — LACTIC ACID, PLASMA: Lactic Acid, Venous: 0.9 mmol/L (ref 0.5–2.2)

## 2012-11-17 LAB — HEPARIN LEVEL (UNFRACTIONATED): Heparin Unfractionated: <0.1 IU/mL — ABNORMAL LOW (ref 0.30–0.70)

## 2012-11-17 MED ORDER — PRO-STAT SUGAR FREE PO LIQD
30.0000 mL | Freq: Three times a day (TID) | ORAL | Status: DC
  Administered 2012-11-17 – 2012-11-18 (×5): 30 mL
  Filled 2012-11-17 (×8): qty 30

## 2012-11-17 MED ORDER — HEPARIN (PORCINE) IN NACL 100-0.45 UNIT/ML-% IJ SOLN
1700.0000 [IU]/h | INTRAMUSCULAR | Status: DC
  Administered 2012-11-17: 1400 [IU]/h via INTRAVENOUS
  Administered 2012-11-17: 1150 [IU]/h via INTRAVENOUS
  Administered 2012-11-18: 1500 [IU]/h via INTRAVENOUS
  Administered 2012-11-19 – 2012-11-20 (×2): 1700 [IU]/h via INTRAVENOUS
  Filled 2012-11-17 (×5): qty 250

## 2012-11-17 MED ORDER — POTASSIUM PHOSPHATE DIBASIC 3 MMOLE/ML IV SOLN
30.0000 mmol | Freq: Once | INTRAVENOUS | Status: AC
  Administered 2012-11-17: 30 mmol via INTRAVENOUS
  Filled 2012-11-17: qty 10

## 2012-11-17 MED ORDER — METOPROLOL TARTRATE 1 MG/ML IV SOLN
3.0000 mg | INTRAVENOUS | Status: DC
  Administered 2012-11-17 – 2012-11-18 (×7): 3 mg via INTRAVENOUS
  Filled 2012-11-17 (×29): qty 5

## 2012-11-17 MED ORDER — HEPARIN BOLUS VIA INFUSION
2400.0000 [IU] | Freq: Once | INTRAVENOUS | Status: AC
  Administered 2012-11-17: 2400 [IU] via INTRAVENOUS
  Filled 2012-11-17: qty 2400

## 2012-11-17 MED ORDER — PROMOTE PO LIQD
1000.0000 mL | ORAL | Status: DC
  Administered 2012-11-17: 1000 mL
  Filled 2012-11-17 (×4): qty 1000

## 2012-11-17 MED ORDER — ASPIRIN 81 MG PO CHEW
81.0000 mg | CHEWABLE_TABLET | Freq: Every day | ORAL | Status: DC
  Administered 2012-11-17 – 2012-11-24 (×8): 81 mg via ORAL
  Filled 2012-11-17 (×8): qty 1

## 2012-11-17 NOTE — Progress Notes (Signed)
PULMONARY  / CRITICAL CARE MEDICINE  Name: Austin Nielsen MRN: [id number] DOB: 06/12/1986    ADMISSION DATE:  11/15/2012 CHIEF COMPLAINT:  Heroin overdose and respiratory failure.    BRIEF PATIENT DESCRIPTION:  27 years old male with PMH relevant for hepatitis C and drug abuse. Presents brought by EMS after being found unresponsive. He was last seen doing well today at 10:00am. He was found at about 6:30 pm. At admission he was unresponsive and was intubated for airway protection. He had pin point pupils, had some posturing movements as per the ED physicians and was tachycardic and hypertensive. There are multiple needle marks in his right arm. At the time of my exam the patient is intubated, sedated with versed, in sinus tachycardia in the 150's and slightly hypertensive. As per the nurse in the room he was moving all 4 extremities and trying to reach for the ETT with his left hand.    LINES / TUBES: - 3 peripheral IV's - Consent for central line taken in case is needed.  CULTURES: -  Results for orders placed during the hospital encounter of 11/15/12  CULTURE, BLOOD (ROUTINE X 2)     Status: None   Collection Time    11/15/12  9:25 PM      Result Value Range Status   Specimen Description BLOOD RIGHT ARM   Final   Special Requests BOTTLES DRAWN AEROBIC AND ANAEROBIC 10CC EACH   Final   Culture  Setup Time 11/16/2012 02:00   Final   Culture     Final   Value:        BLOOD CULTURE RECEIVED NO GROWTH TO DATE CULTURE WILL BE HELD FOR 5 DAYS BEFORE ISSUING A FINAL NEGATIVE REPORT   Report Status PENDING   Incomplete  CULTURE, BLOOD (ROUTINE X 2)     Status: None   Collection Time    11/15/12  9:35 PM      Result Value Range Status   Specimen Description BLOOD RIGHT HAND   Final   Special Requests BOTTLES DRAWN AEROBIC ONLY 10CC   Final   Culture  Setup Time 11/16/2012 02:00   Final   Culture     Final   Value:        BLOOD CULTURE RECEIVED NO GROWTH TO DATE CULTURE WILL BE HELD  FOR 5 DAYS BEFORE ISSUING A FINAL NEGATIVE REPORT   Report Status PENDING   Incomplete  MRSA PCR SCREENING     Status: None   Collection Time    11/15/12  9:37 PM      Result Value Range Status   MRSA by PCR NEGATIVE  NEGATIVE Final   Comment:            The GeneXpert MRSA Assay (FDA     approved for NASAL specimens     only), is one component of a     comprehensive MRSA colonization     surveillance program. It is not     intended to diagnose MRSA     infection nor to guide or     monitor treatment for     MRSA infections.     ANTIBIOTICS: - Zosyn (11/15/12) - Vancomycin (11/15/12)  Anti-infectives   Start     Dose/Rate Route Frequency Ordered Stop   11/16/12 0600  piperacillin-tazobactam (ZOSYN) IVPB 3.375 g     3.375 g 12.5 mL/hr over 240 Minutes Intravenous 3 times per day 11/15/12 2209     11/15/12 2300  vancomycin (VANCOCIN) IVPB 1000 mg/200 mL premix     1,000 mg 200 mL/hr over 60 Minutes Intravenous Every 12 hours 11/15/12 2209     11/15/12 2215  piperacillin-tazobactam (ZOSYN) IVPB 3.375 g     3.375 g 100 mL/hr over 30 Minutes Intravenous NOW 11/15/12 2209 11/15/12 2330      EVENTS 11/15/2012 > admission 11/16/2012: He seems to be gradually waking up    SUBJECTIVE/OVERNIGHT/INTERVAL HX 11/17/2012: Increasing your weight but still confused and agitated when off sedation. Echocardiogram with depressed ejection fraction. Cardiac enzymes positive for myocardial infarction. Mom continues to be at bedside and very anxious  VITAL SIGNS: Temp:  [98.8 F (37.1 C)-100.2 F (37.9 C)] 99.3 F (37.4 C) (02/25 1220) Pulse Rate:  [74-146] 74 (02/25 1220) Resp:  [14-22] 16 (02/25 1220) BP: (106-139)/(71-103) 117/97 mmHg (02/25 1220) SpO2:  [99 %-100 %] 100 % (02/25 1220) FiO2 (%):  [40 %] 40 % (02/25 1220) HEMODYNAMICS:   VENTILATOR SETTINGS: Vent Mode:  [-] PRVC FiO2 (%):  [40 %] 40 % Set Rate:  [16 bmp] 16 bmp Vt Set:  [520 mL-620 mL] 620 mL PEEP:  [5 cmH20] 5  cmH20 Plateau Pressure:  [16 cmH20-18 cmH20] 17 cmH20 INTAKE / OUTPUT: Intake/Output     02/24 0701 - 02/25 0700 02/25 0701 - 02/26 0700   I.V. (mL/kg) 1197.8 (14.7) 49.3 (0.6)   IV Piggyback 423.5 85   Total Intake(mL/kg) 1621.3 (19.9) 134.3 (1.7)   Urine (mL/kg/hr) 4740 (2.4) 275 (0.6)   Total Output 4740 275   Net -3118.7 -140.7          PHYSICAL EXAMINATION: General: Intubated, sedated,  Eyes: Anicteric sclerae. ENT: Oropharynx clear. Dry mucous membranes. ETT in place Lymph: No cervical, supraclavicular, or axillary lymphadenopathy. Heart: Normal S1, S2. Tachycardic. No murmurs, rubs, or gallops appreciated. No bruits, equal pulses. Lungs: Normal excursion, no dullness to percussion. Good air movement bilaterally, without wheezes or crackles. Normal upper airway sounds without evidence of stridor. Abdomen: Abdomen soft, non-tender and not distended, normoactive bowel sounds. No hepatosplenomegaly or masses. Musculoskeletal: No clubbing or synovitis. Swollen right hand but warm with good capillary filling. Normal distal pulses in all 4 extremities.  Skin: No rashes. Needle marks in the right antecubital area. Neuro: Agitated on wake up assessment. Moves all fours. Gag present. Pupils equal and reactive to light. Appear to follow commands and focus once or twice.  LABS:  Recent Labs Lab 11/15/12 1839 11/15/12 1903 11/15/12 2125 11/16/12 0144 11/16/12 0415 11/16/12 0439 11/16/12 0730 11/16/12 1730 11/17/12 0423  HGB 15.3  --  14.6  --  13.9  --   --   --   --   WBC 18.3*  --  17.1*  --  14.5*  --   --   --   --   PLT 194  --  145*  --  143*  --   --   --   --   NA 134*  --   --  135 137  --   --   --  143  K 6.4*  --   --  4.3 4.5  --   --   --  3.4*  CL 98  --   --  102 104  --   --   --  107  CO2 22  --   --  25 25  --   --   --  26  GLUCOSE 199*  --   --  136* 115*  --   --   --  91  BUN 21  --   --  21 20  --   --   --  10  CREATININE 1.36*  --  1.18 1.04 0.98   --   --   --  0.86  CALCIUM 8.5  --   --  8.4 8.4  --   --   --  9.0  MG 1.7  --   --   --   --   --   --   --  2.0  PHOS  --   --   --   --  1.9*  --   --   --  1.8*  AST 33  --   --   --   --   --   --   --   --   ALT 18  --   --   --   --   --   --   --   --   ALKPHOS 85  --   --   --   --   --   --   --   --   BILITOT 0.2*  --   --   --   --   --   --   --   --   PROT 7.3  --   --   --   --   --   --   --   --   ALBUMIN 3.9  --   --   --   --   --   --   --   --   LATICACIDVEN 4.6*  --   --   --   --   --   --   --  0.9  TROPONINI 0.63*  --   --  0.94*  --   --  0.85* 3.02*  --   PHART  --  7.313*  --   --   --  7.432  --   --   --   PCO2ART  --  40.8  --   --   --  34.5*  --   --   --   PO2ART  --  89.0  --   --   --  153.0*  --   --   --     Recent Labs Lab 11/15/12 2229  GLUCAP 106*    CXR:  - No acute infiltrates. ETT in adequate position.  ASSESSMENT / PLAN: 27 years old male with PMH relevant for hepatitis C and IV drug abuse (heroin). Presents brought by EMS after being found unresponsive. Intubated for airway protection. Urine toxicology screen positive for opiates. CT scan of the head with no acute findings. Slightly elevated troponin. Hyperkalemia (treated), Acute renal failure (creat: 1.36) with mildly elevated CPK (852).   PULMONARY A: 1) Acute respiratory failure due to inability to protect his airway 2) Heroin overdose - 11/17/2012: Does not meet spontaneous breathing trial criteria due to  agitation  P:   - - Mechanical ventilation   - PRVC, Vt: 8cc/kg, PEEP: 5, RR: 16, FiO2: 100% and adjust to keep O2 sat > 96% - VAP prevention order set - SBT in am if meets criteria  CARDIOVASCULAR    Recent Labs Lab 11/15/12 1839 11/16/12 0144 11/16/12 0730 11/16/12 1730  TROPONINI 0.63* 0.94* 0.85* 3.02*   echocardiogram 11/16/2012: Depressed systolic ejection fraction 45%  A:  1) non-STEMI with systolic heart failure P:  - Aspirin - IV heparin -  Cardiology consult Dr. Mayford Knife called - IV Lopressor   RENAL  Recent Labs Lab 11/15/12 1839 11/15/12 2125 11/16/12 0144 11/16/12 0415 11/17/12 0423  NA 134*  --  135 137 143  K 6.4*  --  4.3 4.5 3.4*  CL 98  --  102 104 107  CO2 22  --  25 25 26   GLUCOSE 199*  --  136* 115* 91  BUN 21  --  21 20 10   CREATININE 1.36* 1.18 1.04 0.98 0.86  CALCIUM 8.5  --  8.4 8.4 9.0  MG 1.7  --   --   --  2.0  PHOS  --   --   --  1.9* 1.8*    A:   1) Acute renal failure, likely pre renal, mildly elevated CPK.  11/16/2012 hypophosphatemia 11/14/2012 hypokalemia and hypophosphatemia,replacement electronic ICU P:  - trac electrolytes daily   GASTROINTESTINAL  Recent Labs Lab 11/15/12 1839  AST 33  ALT 18  ALKPHOS 85  BILITOT 0.2*  PROT 7.3  ALBUMIN 3.9    A:   1) No issues P:   - GI prophylaxis with protonix - Start tube feeds  HEMATOLOGIC  Recent Labs Lab 11/15/12 1839 11/15/12 2125 11/16/12 0415  HGB 15.3 14.6 13.9  HCT 44.4 43.9 41.0  WBC 18.3* 17.1* 14.5*  PLT 194 145* 143*    A:   1) No issues P:  - Will follow CBC  INFECTIOUS  Recent Labs Lab 11/15/12 1839 11/17/12 0423  LATICACIDVEN 4.6* 0.9   No results found for this basename: PROCALCITON,  in the last 168 hours  A:   - No evidence of acute infection or aspiration. HIV-negative 11/16/2012 -  P:  -  Stop vancomycin - Continue Zosyn  - Check pro-calcitonin algorithm to decide stop date of antibiotic     ENDOCRINE A:   1) No issues   NEUROLOGIC A:   1) Heroin overdose 2) Possible anoxic brain injury. No acute findings on head CT. - 11/17/2012: Improving mental status P:   - neurology consultation (mom keen on this) - Use propofol for sedation    Global  - 11/16/2012: Mother and sister updated. Mother  significant spiritual distress due to loss of another sibling or drug problems patient relapsing  with the same since October 2013. Chaplain consult called   - 11/17/2012: Mom  updated    The patient is critically ill with multiple organ systems failure and requires high complexity decision making for assessment and support, frequent evaluation and titration of therapies, application of advanced monitoring technologies and extensive interpretation of multiple databases.   Critical Care Time devoted to patient care services described in this note is  31  Minutes.      Dr. Kalman Shan, M.D., [facility].C.P Pulmonary and Critical Care Medicine Staff Physician [facility] Pager: [phone], If no answer or between  15:00h - 7:00h: call [phone]  11/17/2012 12:21 PM

## 2012-11-17 NOTE — Progress Notes (Signed)
ANTICOAGULATION CONSULT NOTE - Initial Consult  Pharmacy Consult for Heparin Indication: chest pain/ACS  No Known Allergies  Patient Measurements: Height: 6' (182.9 cm) Weight: 179 lb 3.7 oz (81.3 kg) IBW/kg (Calculated) : 77.6 Heparin Dosing Weight: 81 Kg  Vital Signs: Temp: 99.6 F (37.6 C) (02/25 0600) BP: 128/97 mmHg (02/25 0800) Pulse Rate: 83 (02/25 0900)  Labs:  Recent Labs  11/15/12 1839 11/15/12 1932 11/15/12 2125 11/16/12 0144 11/16/12 0415 11/16/12 0730 11/16/12 1730 11/17/12 0423  HGB 15.3  --  14.6  --  13.9  --   --   --   HCT 44.4  --  43.9  --  41.0  --   --   --   PLT 194  --  145*  --  143*  --   --   --   CREATININE 1.36*  --  1.18 1.04 0.98  --   --  0.86  CKTOTAL  --  852*  --  1835*  --   --   --   --   TROPONINI 0.63*  --   --  0.94*  --  0.85* 3.02*  --     Estimated Creatinine Clearance: 140.4 ml/min (by C-G formula based on Cr of 0.86).   Medical History: Past Medical History  Diagnosis Date  . Hepatitis C     Medications:  Prescriptions prior to admission  Medication Sig Dispense Refill  . Pseudoephedrine-Ibuprofen (ADVIL COLD/SINUS PO) Take 1 tablet by mouth as needed (for cold symptoms).        Assessment: Austin Nielsen was admitted s/p heroine OD, respiratory failure and found unresponsive. Noted his BP is normal, but continues to have HR in 100s. Trop is elevated, peaked at 3.02 (2/24 @ 1730). No prior CAD hx noted. No prior anticoagulants PTA, received SQ heparin 5000 units for VTE px this AM at 0600. H/H and plts are ok.  Goal of Therapy:  Heparin level 0.3-0.7 units/ml Monitor platelets by anticoagulation protocol: Yes   Plan:  Give NO units bolus x 1 Start heparin infusion at 1150 units/hr Check anti-Xa level in 6 hours and daily while on heparin Continue to monitor H&H and platelets Will d/c SQ heparin (last dose given ~ 0600 11/17/2012)  Thanks, Austin Nielsen, PharmD, BCPS.  Clinical Pharmacist Pager  [phone]. 11/17/2012 12:16 PM

## 2012-11-17 NOTE — Progress Notes (Signed)
ANTICOAGULATION CONSULT NOTE - Follow Up Consult  Pharmacy Consult for Heparin Indication: chest pain/ACS  Patient Measurements: Height: 6' (182.9 cm) Weight: 179 lb 3.7 oz (81.3 kg) IBW/kg (Calculated) : 77.6 Heparin Dosing Weight: 81kg   Labs:  Recent Labs  11/15/12 1839 11/15/12 1932 11/15/12 2125 11/16/12 0144 11/16/12 0415 11/16/12 0730 11/16/12 1730 11/17/12 0423 11/17/12 1509 11/17/12 1819  HGB 15.3  --  14.6  --  13.9  --   --   --   --   --   HCT 44.4  --  43.9  --  41.0  --   --   --   --   --   PLT 194  --  145*  --  143*  --   --   --   --   --   HEPARINUNFRC  --   --   --   --   --   --   --   --   --  <0.10*  CREATININE 1.36*  --  1.18 1.04 0.98  --   --  0.86  --   --   CKTOTAL  --  852*  --  1835*  --   --   --   --  625*  --   CKMB  --   --   --   --   --   --   --   --  4.0  --   TROPONINI 0.63*  --   --  0.94*  --  0.85* 3.02*  --   --   --    Estimated Creatinine Clearance: 140.4 ml/min (by C-G formula based on Cr of 0.86).  Assessment: 27yo M being continued on IV heparin for NSTEMI. HL < 0.10 after being started this AM @ 1150 units/hr. Per RN, no interruptions in heparin and no bleeding reported.   Goal of Therapy:  Heparin level 0.3-0.7 units/ml Monitor platelets by anticoagulation protocol: Yes   Plan:  1) Heparin IV 2400 units bolus x 1  2) Increase IV heparin rate to 1400 units/hr  3) Check 6 hour heparin level 4) Continue to monitor daily heparin level and CBC 5) Monitor signs/symptoms of bleeding  Benjaman Pott, PharmD, BCPS 11/17/2012   7:22 PM

## 2012-11-17 NOTE — Progress Notes (Signed)
INITIAL NUTRITION ASSESSMENT  DOCUMENTATION CODES Per approved criteria  -Not Applicable   INTERVENTION:  Initiate TF via OG tube with Promote at 25 ml/h, increase by 10 ml every 4 hours to goal rate of 45 ml/h with Prostat 30 ml TID to provide 1380 kcals, 113 gm protein, 906 ml free water daily.  TF plus Propofol will provide a total of 2154 kcals (99% of estimated needs).  NUTRITION DIAGNOSIS: Inadequate oral intake related to inability to eat as evidenced by NPO status.   Goal: Intake to meet >90% of estimated nutrition needs.  Monitor:  TF tolerance/adequacy, weight trend, labs, I/O  Reason for Assessment: MD Consult for TF initiation and management.  27 y.o. male  Admitting Dx: Heroin overdose and respiratory failure  ASSESSMENT: Patient was brought to the ED by EMS after being found unresponsive.  On admission he was unresponsive and was intubated for airway protection.  Urine toxicology screen positive for opiates.  Hyperkalemia on admission has resolved.  Acute renal failure is improving.  Patient is currently intubated on ventilator support.  MV: 10 Temp:Temp (24hrs), Avg:99.6 F (37.6 C), Min:99.1 F (37.3 C), Max:100.2 F (37.9 C)  Propofol: 29.3 ml/hr providing 774 kcals/day.  Nutrition Focused Physical Exam:  Subcutaneous Fat:  Orbital Region: WNL Upper Arm Region: NA Thoracic and Lumbar Region: NA  Muscle:  Temple Region: WNL Clavicle Bone Region: WNL Clavicle and Acromion Bone Region: WNL Scapular Bone Region: NA Dorsal Hand: NA Patellar Region: WNL Anterior Thigh Region: WNL Posterior Calf Region: WNL   Height: Ht Readings from Last 1 Encounters:  11/15/12 6' (1.829 m)    Weight: Wt Readings from Last 1 Encounters:  11/15/12 179 lb 3.7 oz (81.3 kg)    Ideal Body Weight: 80.9 kg  % Ideal Body Weight: 100%  Wt Readings from Last 10 Encounters:  11/15/12 179 lb 3.7 oz (81.3 kg)    Usual Body Weight: unknown   BMI:  Body mass  index is 24.3 kg/(m^2).  Estimated Nutritional Needs: Kcal: 2180 Protein: 110-130 gm Fluid: 2.2 L  Skin: no problems noted  Diet Order: NPO  EDUCATION NEEDS: -Education not appropriate at this time   Intake/Output Summary (Last 24 hours) at 11/17/12 1348 Last data filed at 11/17/12 0900  Gross per 24 hour  Intake 1238.6 ml  Output   3725 ml  Net -2486.4 ml    Labs:   Recent Labs Lab 11/15/12 1839  11/16/12 0144 11/16/12 0415 11/17/12 0423  NA 134*  --  135 137 143  K 6.4*  --  4.3 4.5 3.4*  CL 98  --  102 104 107  CO2 22  --  25 25 26   BUN 21  --  21 20 10   CREATININE 1.36*  < > 1.04 0.98 0.86  CALCIUM 8.5  --  8.4 8.4 9.0  MG 1.7  --   --   --  2.0  PHOS  --   --   --  1.9* 1.8*  GLUCOSE 199*  --  136* 115* 91  < > = values in this interval not displayed.  CBG (last 3)   Recent Labs  11/15/12 2229 11/17/12 1226  GLUCAP 106* 118*    Scheduled Meds: . sodium chloride   Intravenous Once  . aspirin  81 mg Oral Daily  . LORazepam  2 mg Intravenous Once  . metoprolol  3 mg Intravenous Q3H  . pantoprazole (PROTONIX) IV  40 mg Intravenous QHS  . piperacillin-tazobactam (ZOSYN)  IV  3.375 g Intravenous Q8H  . potassium phosphate IVPB (mmol)  30 mmol Intravenous Once    Continuous Infusions: . heparin 1,150 Units/hr (11/17/12 1240)  . propofol 60 mcg/kg/min (11/17/12 0913)    Past Medical History  Diagnosis Date  . Hepatitis C     History reviewed. No pertinent past surgical history.  Joaquin Courts, RD, LDN, CNSC Pager# [phone] After Hours Pager# [phone]

## 2012-11-17 NOTE — Progress Notes (Signed)
[facility] ADULT ICU REPLACEMENT PROTOCOL FOR AM LAB REPLACEMENT ONLY  The patient does not apply for the [facility] Adult ICU Electrolyte Replacment Protocol based on the criteria listed below:   1. Is GFR >/= 40 ml/min? yes  Patient's GFR today is >90 2. Is urine output >/= 0.5 ml/kg/hr for the last 6 hours? no Patient's UOP is  ml/kg/hr 3. Is BUN < 60 mg/dL? yes  Patient's BUN today is 10 4. Abnormal electrolyte(s): K 3.4, Phos 1.8  5. Ordered repletion with:  6. If a panic level lab has been reported, has the CCM MD in charge been notified? yes.   Physician:  Dr deterding  No UOP recorded  [facility], Caela Huot A 11/17/2012 6:18 AM

## 2012-11-17 NOTE — Progress Notes (Signed)
Clinical Social Worker staffed case with MD during progression.  Pt remains intubated and sedated; no family currently at bedside. CSW to continue to follow and assist as needed.   Angelia Mould, MSW, [place] [phone]

## 2012-11-17 NOTE — Progress Notes (Signed)
eLink Physician-Brief Progress Note Patient Name: Austin Nielsen DOB: 02-11-1986 MRN: [id number]  Date of Service  11/17/2012   HPI/Events of Note  Hypokalemia and hypophosphatemia  eICU Interventions  Potassium and phos replaced   Intervention Category Intermediate Interventions: Electrolyte abnormality - evaluation and management  DETERDING,ELIZABETH 11/17/2012, 6:32 AM

## 2012-11-17 NOTE — Consult Note (Addendum)
Admit date: 11/15/2012 Referring Physician  Dr. Marchelle Gearing Primary Physician  None Primary Cardiologist  None Reason for Consultation  Abnormal cardiac enzymes  HPI: 27 years old male with PMH relevant for hepatitis C and drug abuse. Presents brought by EMS after being found unresponsive. He was last seen doing well today at 10:00am. He was found at about 6:30 pm. At admission he was unresponsive and was intubated for airway protection. He had pin point pupils, had some posturing movements as per the ED physicians and was tachycardic and hypertensive. There are multiple needle marks in his right arm.  He was found to have an elevated troponin and CPK and 2D echo showed mildly reduced LVF with EF 40-45% with apical hypokinesis and we are now asked to consult.     PMH:   Past Medical History  Diagnosis Date  . Hepatitis C      PSH:  History reviewed. No pertinent past surgical history.  Allergies:  Review of patient's allergies indicates no known allergies. Prior to Admit Meds:   Prescriptions prior to admission  Medication Sig Dispense Refill  . Pseudoephedrine-Ibuprofen (ADVIL COLD/SINUS PO) Take 1 tablet by mouth as needed (for cold symptoms).       Fam HX:   History reviewed. No pertinent family history. Social HX:    History   Social History  . Marital Status: Single    Spouse Name: N/A    Number of Children: N/A  . Years of Education: N/A   Occupational History  . Not on file.   Social History Main Topics  . Smoking status: Current Every Day Smoker -- 1.00 packs/day    Types: Cigarettes  . Smokeless tobacco: Not on file  . Alcohol Use: Yes  . Drug Use: Yes  . Sexually Active: Not on file   Other Topics Concern  . Not on file   Social History Narrative  . No narrative on file     ROS:  All 11 ROS were addressed and are negative except what is stated in the HPI  Physical Exam: Blood pressure 117/97, pulse 74, temperature 99.3 F (37.4 C), temperature source Oral,  resp. rate 16, height 6' (1.829 m), weight 81.3 kg (179 lb 3.7 oz), SpO2 100.00%.    General: Well developed, well nourished, in no acute distress Head: Eyes PERRLA, No xanthomas.   Normal cephalic and atramatic  Lungs:   Clear bilaterally to auscultation and percussion. Heart:   HRRR S1 S2 Pulses are 2+ & equal.            No carotid bruit. No JVD.  No abdominal bruits. No femoral bruits. Abdomen: Bowel sounds are positive, abdomen soft and non-tender without masses  Extremities:   No clubbing, cyanosis or edema.  DP +1 Neuro: Intubated and sedated  Labs:   Lab Results  Component Value Date   WBC 14.5* 11/16/2012   HGB 13.9 11/16/2012   HCT 41.0 11/16/2012   MCV 93.0 11/16/2012   PLT 143* 11/16/2012    Recent Labs Lab 11/15/12 1839  11/17/12 0423  NA 134*  < > 143  K 6.4*  < > 3.4*  CL 98  < > 107  CO2 22  < > 26  BUN 21  < > 10  CREATININE 1.36*  < > 0.86  CALCIUM 8.5  < > 9.0  PROT 7.3  --   --   BILITOT 0.2*  --   --   ALKPHOS 85  --   --  ALT 18  --   --   AST 33  --   --   GLUCOSE 199*  < > 91  < > = values in this interval not displayed. No results found for this basename: PTT   No results found for this basename: INR, PROTIME   Lab Results  Component Value Date   CKTOTAL 1835* 11/16/2012   TROPONINI 3.02* 11/16/2012         Radiology:  Ct Head Wo Contrast  11/15/2012  *RADIOLOGY REPORT*  Clinical Data: Unresponsive.  Altered mental status.  CT HEAD WITHOUT CONTRAST  Technique:  Contiguous axial images were obtained from the base of the skull through the vertex without contrast.  Comparison: None.  Findings: The brain has a normal appearance without evidence of malformation, atrophy, old or acute infarction, mass lesion, hemorrhage, hydrocephalus or significant extra-axial collection. 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  No skull fracture.  There are some mucosal inflammatory changes of the sinuses, most notable on the  right maxillary sinus.  IMPRESSION: Normal appearance of the brain.  Some sinus inflammation, particularly of the right maxillary sinus.   Original Report Authenticated By: Paulina Fusi, M.D.    Dg Chest Port 1 View  11/17/2012  *RADIOLOGY REPORT*  Clinical Data: Evaluate endotracheal tube, shortness of breath  PORTABLE CHEST - 1 VIEW  Comparison: 11/15/2012  Findings:  Grossly unchanged cardiac silhouette and mediastinal contours. Endotracheal tube overlies the tracheal air column with tip superior to the carina.  Interval placement of enteric tube which appears coiled over the gastric fundus. Minimal infrahilar heterogeneous atelectasis.  No focal airspace opacities.  No supine evidence of pneumothorax or pleural effusion.  Grossly unchanged bones.  IMPRESSION: 1.  Appropriately positioned support apparatus as above.  No pneumothorax. 2.  No acute cardiopulmonary disease.   Original Report Authenticated By: Tacey Ruiz, MD    Dg Chest [facility]  11/15/2012  *RADIOLOGY REPORT*  Clinical Data: Respiratory distress.  Status post intubation.  PORTABLE CHEST - 1 VIEW  Comparison: None  Findings: The cardiomediastinal silhouette is unremarkable. An endotracheal tube is identified with tip 4.5 cm above the carina. The lungs are clear. There is no evidence of focal airspace disease, pulmonary edema, suspicious pulmonary nodule/mass, pleural effusion, or pneumothorax. No acute bony abnormalities are identified.  IMPRESSION: Endotracheal tube placement without evidence of active cardiopulmonary disease.   Original Report Authenticated By: Harmon Pier, M.D.     EKG:  NSR with rSR' in V1 and deeply inverted T waves  ASSESSMENT:  1.  Elevated troponin c/w NSTEMI probably stress induced from Heroin overdose.  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   2.  Heroin overdose 3.  Hepatits C 4.  Unresponsive secondary  to Heroin overdose 5.  Hyperkalemia resolved 6.  Acute renal failure improved   PLAN:   1.  Continue to cycle cardiac enzymes.  Will check an MB and CPK as well 2.  Once recovers from acute overdose will need cath 3.  Continue IV Lopressor and ASA and IV Heparin gtt  Quintella Reichert, MD  11/17/2012  12:55 PM

## 2012-11-17 NOTE — Consult Note (Signed)
NEURO HOSPITALIST CONSULT NOTE    Reason for Consult:AMS after heroin OD  HPI:                                                                                                                                         Much of history obtained from chart   Austin Nielsen is an 27 y.o. male  with PMH relevant for hepatitis C and drug abuse. Presents brought by EMS after being found unresponsive (last seen normal 10 hours prior to being found by police in car unresponsive).  At admission he was unresponsive and was intubated for airway protection. He had pin point pupils, had some posturing movements as per the ED physicians and was tachycardic and hypertensive. There are multiple needle marks in his right arm. He was found to have an elevated troponin and CPK and 2D echo showed mildly reduced LVF with EF 40-45% with apical hypokinesis. Currently he is ventilated, breathing over vent, pupils 5 mm bilaterally and reactive.     Past Medical History  Diagnosis Date  . Hepatitis C     History reviewed. No pertinent past surgical history.  Family History: Mother none Father none  Social History:  reports that he has been smoking Cigarettes.  He has been smoking about 1.00 pack per day. He does not have any smokeless tobacco history on file. He reports that  drinks alcohol. He reports that he uses illicit drugs.  No Known Allergies  MEDICATIONS:                                                                                                                     Prior to Admission:  Prescriptions prior to admission  Medication Sig Dispense Refill  . Pseudoephedrine-Ibuprofen (ADVIL COLD/SINUS PO) Take 1 tablet by mouth as needed (for cold symptoms).       Scheduled: . sodium chloride   Intravenous Once  . aspirin  81 mg Oral Daily  . LORazepam  2 mg Intravenous Once  . metoprolol  3 mg Intravenous Q3H  . pantoprazole (PROTONIX) IV  40 mg Intravenous QHS  .  piperacillin-tazobactam (ZOSYN)  IV  3.375 g Intravenous Q8H  . potassium phosphate IVPB (mmol)  30 mmol Intravenous Once     ROS:  History obtained from unobtainable from patient due to mental status    Blood pressure 117/97, pulse 74, temperature 99.3 F (37.4 C), temperature source Oral, resp. rate 16, height 6' (1.829 m), weight 81.3 kg (179 lb 3.7 oz), SpO2 100.00%.   Neurologic Examination:                                                                                                      Mental Status: Intubated, breathing over vent, follows minimal verbal commands when Propofol turned off.  He will lift both arms to command, show his right thumb and wiggle his toes to command.  commands.  Cranial Nerves: II: Discs flat bilaterally; blink to threat bilaterally but inconsistently, pupils equal 6mm bilaterally, round, reactive to light and accommodation III,IV, VI: ptosis not present, extra-ocular motions intact bilaterally to oculocephalic movment V,VII: face symmetric, facial light touch sensation normal bilaterally VIII: no response to verbal IX,X: gag reflex present XI: bilateral shoulder shrug XII: unable to assess Motor: Moving all extremities 5/5 and quickly withdraws from pain.  Tone and bulk:normal tone throughout; no atrophy noted Sensory: Pinprick and light touch intact throughout, bilaterally Deep Tendon Reflexes: 2+ bilateral UE, 2+ bilateral KJ and 2+ bilateral AK symmetric throughout Plantars: Right: downgoing   Left: downgoing Cerebellar: Unable to assess CV: pulses palpable throughout    No results found for this basename: cbc, bmp, coags, chol, tri, ldl, hga1c    Results for orders placed during the hospital encounter of 11/15/12 (from the past 48 hour(s))  ACETAMINOPHEN LEVEL     Status: None   Collection Time     11/15/12  6:39 PM      Result Value Range   Acetaminophen (Tylenol), Serum <15.0  10 - 30 ug/mL   Comment:            THERAPEUTIC CONCENTRATIONS VARY     SIGNIFICANTLY. A RANGE OF 10-30     ug/mL MAY BE AN EFFECTIVE     CONCENTRATION FOR MANY PATIENTS.     HOWEVER, SOME ARE BEST TREATED     AT CONCENTRATIONS OUTSIDE THIS     RANGE.     ACETAMINOPHEN CONCENTRATIONS     >150 ug/mL AT 4 HOURS AFTER     INGESTION AND >50 ug/mL AT 12     HOURS AFTER INGESTION ARE     OFTEN ASSOCIATED WITH TOXIC     REACTIONS.  BASIC METABOLIC PANEL     Status: Abnormal   Collection Time    11/15/12  6:39 PM      Result Value Range   Sodium 134 (*) 135 - 145 mEq/L   Potassium 6.4 (*) 3.5 - 5.1 mEq/L   Comment: NO VISIBLE HEMOLYSIS     CRITICAL RESULT CALLED TO, READ BACK BY AND VERIFIED WITH:     SMITH,J RN 11/15/12 1938 WOOTEN,K   Chloride 98  96 - 112 mEq/L   CO2 22  19 - 32 mEq/L   Glucose, Bld 199 (*) 70 - 99 mg/dL   BUN 21  6 - 23 mg/dL  Creatinine, Ser 1.36 (*) 0.50 - 1.35 mg/dL   Calcium 8.5  8.4 - 45.4 mg/dL   GFR calc non Af Amer 70 (*) >90 mL/min   GFR calc Af Amer 81 (*) >90 mL/min   Comment:            The eGFR has been calculated     using the CKD EPI equation.     This calculation has not been     validated in all clinical     situations.     eGFR's persistently     <90 mL/min signify     possible Chronic Kidney Disease.  CBC WITH DIFFERENTIAL     Status: Abnormal   Collection Time    11/15/12  6:39 PM      Result Value Range   WBC 18.3 (*) 4.0 - 10.5 K/uL   RBC 4.69  4.22 - 5.81 MIL/uL   Hemoglobin 15.3  13.0 - 17.0 g/dL   HCT 09.8  11.9 - 14.7 %   MCV 94.7  78.0 - 100.0 fL   MCH 32.6  26.0 - 34.0 pg   MCHC 34.5  30.0 - 36.0 g/dL   RDW 82.9  56.2 - 13.0 %   Platelets 194  150 - 400 K/uL   Neutrophils Relative 84 (*) 43 - 77 %   Neutro Abs 15.3 (*) 1.7 - 7.7 K/uL   Lymphocytes Relative 10 (*) 12 - 46 %   Lymphs Abs 1.7  0.7 - 4.0 K/uL   Monocytes Relative 7  3 - 12  %   Monocytes Absolute 1.3 (*) 0.1 - 1.0 K/uL   Eosinophils Relative 0  0 - 5 %   Eosinophils Absolute 0.0  0.0 - 0.7 K/uL   Basophils Relative 0  0 - 1 %   Basophils Absolute 0.0  0.0 - 0.1 K/uL  HEPATIC FUNCTION PANEL     Status: Abnormal   Collection Time    11/15/12  6:39 PM      Result Value Range   Total Protein 7.3  6.0 - 8.3 g/dL   Albumin 3.9  3.5 - 5.2 g/dL   AST 33  0 - 37 U/L   ALT 18  0 - 53 U/L   Alkaline Phosphatase 85  39 - 117 U/L   Total Bilirubin 0.2 (*) 0.3 - 1.2 mg/dL   Bilirubin, Direct 0.1  0.0 - 0.3 mg/dL   Indirect Bilirubin 0.1 (*) 0.3 - 0.9 mg/dL  SALICYLATE LEVEL     Status: Abnormal   Collection Time    11/15/12  6:39 PM      Result Value Range   Salicylate Lvl <2.0 (*) 2.8 - 20.0 mg/dL  OSMOLALITY     Status: None   Collection Time    11/15/12  6:39 PM      Result Value Range   Osmolality 291  275 - 300 mOsm/kg  ETHANOL     Status: None   Collection Time    11/15/12  6:39 PM      Result Value Range   Alcohol, Ethyl (B) <11  0 - 11 mg/dL   Comment:            LOWEST DETECTABLE LIMIT FOR     SERUM ALCOHOL IS 11 mg/dL     FOR MEDICAL PURPOSES ONLY  MAGNESIUM     Status: None   Collection Time    11/15/12  6:39 PM      Result Value Range  Magnesium 1.7  1.5 - 2.5 mg/dL  TROPONIN I     Status: Abnormal   Collection Time    11/15/12  6:39 PM      Result Value Range   Troponin I 0.63 (*) <0.30 ng/mL   Comment:            Due to the release kinetics of cTnI,     a negative result within the first hours     of the onset of symptoms does not rule out     myocardial infarction with certainty.     If myocardial infarction is still suspected,     repeat the test at appropriate intervals.     CRITICAL RESULT CALLED TO, READ BACK BY AND VERIFIED WITH:     SMITH,J RN 11/15/12 1937 WOOTEN,K  LACTIC ACID, PLASMA     Status: Abnormal   Collection Time    11/15/12  6:39 PM      Result Value Range   Lactic Acid, Venous 4.6 (*) 0.5 - 2.2 mmol/L   POCT I-STAT 3, BLOOD GAS (G3+)     Status: Abnormal   Collection Time    11/15/12  7:03 PM      Result Value Range   pH, Arterial 7.313 (*) 7.350 - 7.450   pCO2 arterial 40.8  35.0 - 45.0 mmHg   pO2, Arterial 89.0  80.0 - 100.0 mmHg   Bicarbonate 20.7  20.0 - 24.0 mEq/L   TCO2 22  0 - 100 mmol/L   O2 Saturation 96.0     Acid-base deficit 5.0 (*) 0.0 - 2.0 mmol/L   Patient temperature 98.6 F     Collection site RADIAL, ALLEN'S TEST ACCEPTABLE     Drawn by RT     Sample type ARTERIAL    CK     Status: Abnormal   Collection Time    11/15/12  7:32 PM      Result Value Range   Total CK 852 (*) 7 - 232 U/L  URINE RAPID DRUG SCREEN (HOSP PERFORMED)     Status: Abnormal   Collection Time    11/15/12  7:59 PM      Result Value Range   Opiates POSITIVE (*) NONE DETECTED   Cocaine NONE DETECTED  NONE DETECTED   Benzodiazepines NONE DETECTED  NONE DETECTED   Amphetamines NONE DETECTED  NONE DETECTED   Tetrahydrocannabinol NONE DETECTED  NONE DETECTED   Barbiturates NONE DETECTED  NONE DETECTED   Comment:            DRUG SCREEN FOR MEDICAL PURPOSES     ONLY.  IF CONFIRMATION IS NEEDED     FOR ANY PURPOSE, NOTIFY LAB     WITHIN 5 DAYS.                LOWEST DETECTABLE LIMITS     FOR URINE DRUG SCREEN     Drug Class       Cutoff (ng/mL)     Amphetamine      1000     Barbiturate      200     Benzodiazepine   200     Tricyclics       300     Opiates          300     Cocaine          300     THC              50  URINALYSIS, ROUTINE W REFLEX MICROSCOPIC     Status: Abnormal   Collection Time    11/15/12  7:59 PM      Result Value Range   Color, Urine YELLOW  YELLOW   APPearance CLEAR  CLEAR   Specific Gravity, Urine 1.018  1.005 - 1.030   pH 5.5  5.0 - 8.0   Glucose, UA 250 (*) NEGATIVE mg/dL   Hgb urine dipstick TRACE (*) NEGATIVE   Bilirubin Urine NEGATIVE  NEGATIVE   Ketones, ur NEGATIVE  NEGATIVE mg/dL   Protein, ur NEGATIVE  NEGATIVE mg/dL   Urobilinogen, UA 0.2  0.0 -  1.0 mg/dL   Nitrite NEGATIVE  NEGATIVE   Leukocytes, UA NEGATIVE  NEGATIVE  URINE MICROSCOPIC-ADD ON     Status: Abnormal   Collection Time    11/15/12  7:59 PM      Result Value Range   Squamous Epithelial / LPF FEW (*) RARE   WBC, UA 0-2  <3 WBC/hpf   RBC / HPF 0-2  <3 RBC/hpf   Bacteria, UA FEW (*) RARE   Casts HYALINE CASTS (*) NEGATIVE  CBC     Status: Abnormal   Collection Time    11/15/12  9:25 PM      Result Value Range   WBC 17.1 (*) 4.0 - 10.5 K/uL   RBC 4.64  4.22 - 5.81 MIL/uL   Hemoglobin 14.6  13.0 - 17.0 g/dL   HCT 16.1  09.6 - 04.5 %   MCV 94.6  78.0 - 100.0 fL   MCH 31.5  26.0 - 34.0 pg   MCHC 33.3  30.0 - 36.0 g/dL   RDW 40.9  81.1 - 91.4 %   Platelets 145 (*) 150 - 400 K/uL   Comment: DELTA CHECK NOTED     REPEATED TO VERIFY  CREATININE, SERUM     Status: Abnormal   Collection Time    11/15/12  9:25 PM      Result Value Range   Creatinine, Ser 1.18  0.50 - 1.35 mg/dL   GFR calc non Af Amer 83 (*) >90 mL/min   GFR calc Af Amer >90  >90 mL/min   Comment:            The eGFR has been calculated     using the CKD EPI equation.     This calculation has not been     validated in all clinical     situations.     eGFR's persistently     <90 mL/min signify     possible Chronic Kidney Disease.  CULTURE, BLOOD (ROUTINE X 2)     Status: None   Collection Time    11/15/12  9:25 PM      Result Value Range   Specimen Description BLOOD RIGHT ARM     Special Requests BOTTLES DRAWN AEROBIC AND ANAEROBIC 10CC EACH     Culture  Setup Time 11/16/2012 02:00     Culture       Value:        BLOOD CULTURE RECEIVED NO GROWTH TO DATE CULTURE WILL BE HELD FOR 5 DAYS BEFORE ISSUING A FINAL NEGATIVE REPORT   Report Status PENDING    CULTURE, BLOOD (ROUTINE X 2)     Status: None   Collection Time    11/15/12  9:35 PM      Result Value Range   Specimen Description BLOOD RIGHT HAND     Special Requests BOTTLES DRAWN AEROBIC ONLY 10CC  Culture  Setup Time 11/16/2012  02:00     Culture       Value:        BLOOD CULTURE RECEIVED NO GROWTH TO DATE CULTURE WILL BE HELD FOR 5 DAYS BEFORE ISSUING A FINAL NEGATIVE REPORT   Report Status PENDING    MRSA PCR SCREENING     Status: None   Collection Time    11/15/12  9:37 PM      Result Value Range   MRSA by PCR NEGATIVE  NEGATIVE   Comment:            The GeneXpert MRSA Assay (FDA     approved for NASAL specimens     only), is one component of a     comprehensive MRSA colonization     surveillance program. It is not     intended to diagnose MRSA     infection nor to guide or     monitor treatment for     MRSA infections.  GLUCOSE, CAPILLARY     Status: Abnormal   Collection Time    11/15/12 10:29 PM      Result Value Range   Glucose-Capillary 106 (*) 70 - 99 mg/dL  BASIC METABOLIC PANEL     Status: Abnormal   Collection Time    11/16/12  1:44 AM      Result Value Range   Sodium 135  135 - 145 mEq/L   Potassium 4.3  3.5 - 5.1 mEq/L   Chloride 102  96 - 112 mEq/L   CO2 25  19 - 32 mEq/L   Glucose, Bld 136 (*) 70 - 99 mg/dL   BUN 21  6 - 23 mg/dL   Creatinine, Ser 1.61  0.50 - 1.35 mg/dL   Calcium 8.4  8.4 - 09.6 mg/dL   GFR calc non Af Amer >90  >90 mL/min   GFR calc Af Amer >90  >90 mL/min   Comment:            The eGFR has been calculated     using the CKD EPI equation.     This calculation has not been     validated in all clinical     situations.     eGFR's persistently     <90 mL/min signify     possible Chronic Kidney Disease.  TROPONIN I     Status: Abnormal   Collection Time    11/16/12  1:44 AM      Result Value Range   Troponin I 0.94 (*) <0.30 ng/mL   Comment:            Due to the release kinetics of cTnI,     a negative result within the first hours     of the onset of symptoms does not rule out     myocardial infarction with certainty.     If myocardial infarction is still suspected,     repeat the test at appropriate intervals.     CRITICAL VALUE NOTED.  VALUE IS  CONSISTENT WITH PREVIOUSLY REPORTED AND CALLED VALUE.  HIV ANTIBODY (ROUTINE TESTING)     Status: None   Collection Time    11/16/12  1:44 AM      Result Value Range   HIV NON REACTIVE  NON REACTIVE  CK     Status: Abnormal   Collection Time    11/16/12  1:44 AM      Result Value Range   Total CK  1835 (*) 7 - 232 U/L  CBC     Status: Abnormal   Collection Time    11/16/12  4:15 AM      Result Value Range   WBC 14.5 (*) 4.0 - 10.5 K/uL   RBC 4.41  4.22 - 5.81 MIL/uL   Hemoglobin 13.9  13.0 - 17.0 g/dL   HCT 16.1  09.6 - 04.5 %   MCV 93.0  78.0 - 100.0 fL   MCH 31.5  26.0 - 34.0 pg   MCHC 33.9  30.0 - 36.0 g/dL   RDW 40.9  81.1 - 91.4 %   Platelets 143 (*) 150 - 400 K/uL  BASIC METABOLIC PANEL     Status: Abnormal   Collection Time    11/16/12  4:15 AM      Result Value Range   Sodium 137  135 - 145 mEq/L   Potassium 4.5  3.5 - 5.1 mEq/L   Chloride 104  96 - 112 mEq/L   CO2 25  19 - 32 mEq/L   Glucose, Bld 115 (*) 70 - 99 mg/dL   BUN 20  6 - 23 mg/dL   Creatinine, Ser 7.82  0.50 - 1.35 mg/dL   Calcium 8.4  8.4 - 95.6 mg/dL   GFR calc non Af Amer >90  >90 mL/min   GFR calc Af Amer >90  >90 mL/min   Comment:            The eGFR has been calculated     using the CKD EPI equation.     This calculation has not been     validated in all clinical     situations.     eGFR's persistently     <90 mL/min signify     possible Chronic Kidney Disease.  PHOSPHORUS     Status: Abnormal   Collection Time    11/16/12  4:15 AM      Result Value Range   Phosphorus 1.9 (*) 2.3 - 4.6 mg/dL  BLOOD GAS, ARTERIAL     Status: Abnormal   Collection Time    11/16/12  4:39 AM      Result Value Range   FIO2 0.40     Delivery systems VENTILATOR     Mode PRESSURE REGULATED VOLUME CONTROL     VT 620     Rate 16     Peep/cpap 5.0     pH, Arterial 7.432  7.350 - 7.450   pCO2 arterial 34.5 (*) 35.0 - 45.0 mmHg   pO2, Arterial 153.0 (*) 80.0 - 100.0 mmHg   Bicarbonate 22.6  20.0 - 24.0  mEq/L   TCO2 23.6  0 - 100 mmol/L   Acid-base deficit 1.1  0.0 - 2.0 mmol/L   O2 Saturation 99.1     Patient temperature 98.6     Collection site LEFT RADIAL     Drawn by [id number]     Sample type ARTERIAL DRAW     Allens test (pass/fail) PASS  PASS  TROPONIN I     Status: Abnormal   Collection Time    11/16/12  7:30 AM      Result Value Range   Troponin I 0.85 (*) <0.30 ng/mL   Comment:            Due to the release kinetics of cTnI,     a negative result within the first hours     of the onset of symptoms does not rule out  myocardial infarction with certainty.     If myocardial infarction is still suspected,     repeat the test at appropriate intervals.     CRITICAL VALUE NOTED.  VALUE IS CONSISTENT WITH PREVIOUSLY REPORTED AND CALLED VALUE.  TROPONIN I     Status: Abnormal   Collection Time    11/16/12  5:30 PM      Result Value Range   Troponin I 3.02 (*) <0.30 ng/mL   Comment:            Due to the release kinetics of cTnI,     a negative result within the first hours     of the onset of symptoms does not rule out     myocardial infarction with certainty.     If myocardial infarction is still suspected,     repeat the test at appropriate intervals.     CRITICAL VALUE NOTED.  VALUE IS CONSISTENT WITH PREVIOUSLY REPORTED AND CALLED VALUE.  MAGNESIUM     Status: None   Collection Time    11/17/12  4:23 AM      Result Value Range   Magnesium 2.0  1.5 - 2.5 mg/dL  PHOSPHORUS     Status: Abnormal   Collection Time    11/17/12  4:23 AM      Result Value Range   Phosphorus 1.8 (*) 2.3 - 4.6 mg/dL  BASIC METABOLIC PANEL     Status: Abnormal   Collection Time    11/17/12  4:23 AM      Result Value Range   Sodium 143  135 - 145 mEq/L   Potassium 3.4 (*) 3.5 - 5.1 mEq/L   Comment: DELTA CHECK NOTED   Chloride 107  96 - 112 mEq/L   CO2 26  19 - 32 mEq/L   Glucose, Bld 91  70 - 99 mg/dL   BUN 10  6 - 23 mg/dL   Creatinine, Ser 1.91  0.50 - 1.35 mg/dL   Calcium 9.0   8.4 - 47.8 mg/dL   GFR calc non Af Amer >90  >90 mL/min   GFR calc Af Amer >90  >90 mL/min   Comment:            The eGFR has been calculated     using the CKD EPI equation.     This calculation has not been     validated in all clinical     situations.     eGFR's persistently     <90 mL/min signify     possible Chronic Kidney Disease.  LACTIC ACID, PLASMA     Status: None   Collection Time    11/17/12  4:23 AM      Result Value Range   Lactic Acid, Venous 0.9  0.5 - 2.2 mmol/L  GLUCOSE, CAPILLARY     Status: Abnormal   Collection Time    11/17/12 12:26 PM      Result Value Range   Glucose-Capillary 118 (*) 70 - 99 mg/dL    Ct Head Wo Contrast  11/15/2012  *RADIOLOGY REPORT*  Clinical Data: Unresponsive.  Altered mental status.  CT HEAD WITHOUT CONTRAST  Technique:  Contiguous axial images were obtained from the base of the skull through the vertex without contrast.  Comparison: None.  Findings: The brain has a normal appearance without evidence of malformation, atrophy, old or acute infarction, mass lesion, hemorrhage, hydrocephalus or significant extra-axial collection. 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  No skull fracture.  There are some mucosal inflammatory changes of the sinuses, most notable on the right maxillary sinus.  IMPRESSION: Normal appearance of the brain.  Some sinus inflammation, particularly of the right maxillary sinus.   Original Report Authenticated By: Paulina Fusi, M.D.    Dg Chest Port 1 View  11/17/2012  *RADIOLOGY REPORT*  Clinical Data: Evaluate endotracheal tube, shortness of breath  PORTABLE CHEST - 1 VIEW  Comparison: 11/15/2012  Findings:  Grossly unchanged cardiac silhouette and mediastinal contours. Endotracheal tube overlies the tracheal air column with tip superior to the carina.  Interval placement of enteric tube which appears coiled over the gastric fundus. Minimal infrahilar heterogeneous atelectasis.  No  focal airspace opacities.  No supine evidence of pneumothorax or pleural effusion.  Grossly unchanged bones.  IMPRESSION: 1.  Appropriately positioned support apparatus as above.  No pneumothorax. 2.  No acute cardiopulmonary disease.   Original Report Authenticated By: Tacey Ruiz, MD    Dg Chest [facility]  11/15/2012  *RADIOLOGY REPORT*  Clinical Data: Respiratory distress.  Status post intubation.  PORTABLE CHEST - 1 VIEW  Comparison: None  Findings: The cardiomediastinal silhouette is unremarkable. An endotracheal tube is identified with tip 4.5 cm above the carina. The lungs are clear. There is no evidence of focal airspace disease, pulmonary edema, suspicious pulmonary nodule/mass, pleural effusion, or pneumothorax. No acute bony abnormalities are identified.  IMPRESSION: Endotracheal tube placement without evidence of active cardiopulmonary disease.   Original Report Authenticated By: Harmon Pier, M.D.    Felicie Morn PA-C [facility] [phone]  11/17/2012, 1:35 PM   I have seen and evaluated the patient. I have reviewed the above note and made appropriate changes.   Assessment/Plan: 27 YO male S/P heroin OD now presenting with NSTEMI probably stress induced and AMS.  He follows commands in all four extremities and I have no clear focal findings on my exam today. I suspect tha he is withdrawing from narcotics contributing to agitation and possibly has a delirium. I would expect this to continue to clear. If once extubated, there are more findings concerning for focal deficits or if he has periods concerning for seizures then could perform further evaluation.   Recommend:   1) Continue to treat underlying metabolic and infective etiologies as labs/cultures return.  2) Consider MRI/EEG if continued expected improvement is not seen.   I have had a thorough discussion with the family and they understand and are in agreement with plan.   Ritta Slot, MD [facility] [phone]  If 7pm- 7am, please page neurology on call at [phone].

## 2012-11-18 ENCOUNTER — Other Ambulatory Visit (HOSPITAL_COMMUNITY): Payer: Self-pay

## 2012-11-18 DIAGNOSIS — I428 Other cardiomyopathies: Secondary | ICD-10-CM

## 2012-11-18 DIAGNOSIS — I214 Non-ST elevation (NSTEMI) myocardial infarction: Secondary | ICD-10-CM

## 2012-11-18 LAB — MAGNESIUM: Magnesium: 2 mg/dL (ref 1.5–2.5)

## 2012-11-18 LAB — PRO B NATRIURETIC PEPTIDE: Pro B Natriuretic peptide (BNP): 2600 pg/mL — ABNORMAL HIGH (ref 0–125)

## 2012-11-18 LAB — PROCALCITONIN: Procalcitonin: 4.44 ng/mL

## 2012-11-18 LAB — BASIC METABOLIC PANEL
Calcium: 8.8 mg/dL (ref 8.4–10.5)
Chloride: 106 mEq/L (ref 96–112)
Creatinine, Ser: 0.85 mg/dL (ref 0.50–1.35)
GFR calc Af Amer: >90 mL/min (ref >90)

## 2012-11-18 LAB — CBC
Platelets: 131 10*3/uL — ABNORMAL LOW (ref 150–400)
RDW: 12 % (ref 11.5–15.5)
WBC: 7.5 10*3/uL (ref 4.0–10.5)

## 2012-11-18 LAB — PHOSPHORUS: Phosphorus: 3.7 mg/dL (ref 2.3–4.6)

## 2012-11-18 LAB — HEPARIN LEVEL (UNFRACTIONATED): Heparin Unfractionated: 0.61 IU/mL (ref 0.30–0.70)

## 2012-11-18 MED ORDER — HEPARIN BOLUS VIA INFUSION
2000.0000 [IU] | Freq: Once | INTRAVENOUS | Status: AC
  Administered 2012-11-18: 2000 [IU] via INTRAVENOUS
  Filled 2012-11-18: qty 2000

## 2012-11-18 MED ORDER — POTASSIUM CHLORIDE 20 MEQ/15ML (10%) PO LIQD
ORAL | Status: AC
  Filled 2012-11-18: qty 30

## 2012-11-18 MED ORDER — MIDAZOLAM HCL 2 MG/2ML IJ SOLN: 2.0000 mg | INTRAMUSCULAR | Status: DC | PRN

## 2012-11-18 MED ORDER — BIOTENE DRY MOUTH MT LIQD
15.0000 mL | Freq: Four times a day (QID) | OROMUCOSAL | Status: DC
  Administered 2012-11-18 – 2012-11-22 (×13): 15 mL via OROMUCOSAL

## 2012-11-18 MED ORDER — DEXMEDETOMIDINE HCL IN NACL 200 MCG/50ML IV SOLN
0.2000 ug/kg/h | INTRAVENOUS | Status: DC
  Administered 2012-11-18 (×2): 1 ug/kg/h via INTRAVENOUS
  Administered 2012-11-18: 1.2 ug/kg/h via INTRAVENOUS
  Administered 2012-11-18: 0.5 ug/kg/h via INTRAVENOUS
  Administered 2012-11-18: 1.2 ug/kg/h via INTRAVENOUS
  Administered 2012-11-19 (×3): 1 ug/kg/h via INTRAVENOUS
  Filled 2012-11-18 (×9): qty 50

## 2012-11-18 MED ORDER — CHLORHEXIDINE GLUCONATE 0.12 % MT SOLN
15.0000 mL | Freq: Two times a day (BID) | OROMUCOSAL | Status: DC
  Administered 2012-11-18 – 2012-11-22 (×9): 15 mL via OROMUCOSAL
  Filled 2012-11-18 (×11): qty 15

## 2012-11-18 MED ORDER — POTASSIUM CHLORIDE 20 MEQ/15ML (10%) PO LIQD
40.0000 meq | ORAL | Status: AC
  Administered 2012-11-18 (×2): 40 meq
  Filled 2012-11-18 (×2): qty 30

## 2012-11-18 MED ORDER — POTASSIUM CHLORIDE 20 MEQ/15ML (10%) PO LIQD
40.0000 meq | Freq: Once | ORAL | Status: DC
  Filled 2012-11-18: qty 30

## 2012-11-18 MED ORDER — FENTANYL CITRATE 0.05 MG/ML IJ SOLN
25.0000 ug | INTRAMUSCULAR | Status: DC | PRN
  Administered 2012-11-19: 50 ug via INTRAVENOUS
  Filled 2012-11-18: qty 2

## 2012-11-18 MED ORDER — METOCLOPRAMIDE HCL 5 MG/ML IJ SOLN
10.0000 mg | Freq: Once | INTRAMUSCULAR | Status: DC
  Filled 2012-11-18: qty 2

## 2012-11-18 NOTE — Progress Notes (Addendum)
ANTICOAGULATION CONSULT NOTE - Follow Up Consult  Pharmacy Consult for Heparin, Zosyn Indication: chest pain/ACS, Sepsis  Patient Measurements: Height: 6' (182.9 cm) Weight: 179 lb 3.7 oz (81.3 kg) IBW/kg (Calculated) : 77.6 Heparin Dosing Weight: 81kg   Labs:  Recent Labs  11/15/12 1932 11/15/12 2125 11/16/12 0144 11/16/12 0415 11/16/12 0730 11/16/12 1730 11/17/12 0423 11/17/12 1509 11/17/12 1819 11/18/12 0130 11/18/12 1028  HGB  --  14.6  --  13.9  --   --   --   --   --  12.5*  --   HCT  --  43.9  --  41.0  --   --   --   --   --  37.0*  --   PLT  --  145*  --  143*  --   --   --   --   --  131*  --   HEPARINUNFRC  --   --   --   --   --   --   --   --  <0.10* 0.29* 0.24*  CREATININE  --  1.18 1.04 0.98  --   --  0.86  --   --  0.85  --   CKTOTAL 852*  --  1835*  --   --   --   --  625*  --   --   --   CKMB  --   --   --   --   --   --   --  4.0  --   --   --   TROPONINI  --   --  0.94*  --  0.85* 3.02*  --   --   --   --   --    Estimated Creatinine Clearance: 142 ml/min (by C-G formula based on Cr of 0.85).  Assessment: 27yo M being continued on IV heparin for NSTEMI. HL remains subtherapeutic despite rate increase and biolus dose given last PM. Per RN, no interruptions in heparin and no bleeding reported. H/H and plts decreased- might be some hemodilution as well.  Patient continues on Zosyn empirically, WBC nml, 17.1 as of 2/24, low grd fever continues. LA now nml. ECHO nml for valves. PCT down to 4.44- MD wants to continue Zosyn for now. SCr nml, UOP 1 ml/k/h  2/23 Vanco>>2/25 2/23 Zosyn>>  2/23 Bld x 2: ngtd 2/23: MRSA pcr: neg  Goal of Therapy:  Heparin level 0.3-0.7 units/ml Monitor platelets by anticoagulation protocol: Yes   Plan:  - Heparin IV 2000 units bolus x 1  - Increase IV heparin rate to 1700 units/hr  - Check 6 hour heparin level - Continue to monitor daily heparin level and CBC - Will monitor signs/symptoms of bleeding - Cont Zosyn  3.375gm IV q8h.   Thanks, Jacek Colson K. Allena Katz, PharmD, BCPS.  Clinical Pharmacist Pager [phone]. 11/18/2012 12:50 PM

## 2012-11-18 NOTE — Progress Notes (Addendum)
SUBJECTIVE:  Remains intubated and sedated  OBJECTIVE:   Vitals:   Filed Vitals:   11/18/12 0300 11/18/12 0400 11/18/12 0500 11/18/12 0600  BP: 101/59 100/53 95/70 123/77  Pulse: 79 62 89 66  Temp: 99.8 F (37.7 C) 100 F (37.8 C) 99.1 F (37.3 C) 99.5 F (37.5 C)  TempSrc:      Resp: 16 16 15 16   Height:      Weight:      SpO2: 99% 100% 100% 100%   I&O's:   Intake/Output Summary (Last 24 hours) at 11/18/12 0754 Last data filed at 11/18/12 0600  Gross per 24 hour  Intake 2138.2 ml  Output   1675 ml  Net  463.2 ml   TELEMETRY: Reviewed telemetry pt in NSR:     PHYSICAL EXAM General: Well developed, well nourished, in no acute distress Head: Eyes PERRLA, No xanthomas.   Normal cephalic and atramatic  Lungs:   Clear bilaterally to auscultation and percussion. Heart:   HRRR S1 S2 Pulses are 2+ & equal. Abdomen: Bowel sounds are positive, abdomen soft and non-tender without masses  Extremities:   No clubbing, cyanosis or edema.  DP +1 Neuro: intubated    LABS: Basic Metabolic Panel:  Recent Labs  16/10/96 0423 11/18/12 0130  NA 143 142  K 3.4* 3.3*  CL 107 106  CO2 26 24  GLUCOSE 91 94  BUN 10 12  CREATININE 0.86 0.85  CALCIUM 9.0 8.8  MG 2.0 2.0  PHOS 1.8* 3.7   Liver Function Tests:  Recent Labs  11/15/12 1839  AST 33  ALT 18  ALKPHOS 85  BILITOT 0.2*  PROT 7.3  ALBUMIN 3.9   No results found for this basename: LIPASE, AMYLASE,  in the last 72 hours CBC:  Recent Labs  11/15/12 1839  11/16/12 0415 11/18/12 0130  WBC 18.3*  < > 14.5* 7.5  NEUTROABS 15.3*  --   --   --   HGB 15.3  < > 13.9 12.5*  HCT 44.4  < > 41.0 37.0*  MCV 94.7  < > 93.0 92.5  PLT 194  < > 143* 131*  < > = values in this interval not displayed. Cardiac Enzymes:  Recent Labs  11/15/12 1932 11/16/12 0144 11/16/12 0730 11/16/12 1730 11/17/12 1509  CKTOTAL 852* 1835*  --   --  625*  CKMB  --   --   --   --  4.0  TROPONINI  --  0.94* 0.85* 3.02*  --       RADIOLOGY: Ct Head Wo Contrast  11/15/2012  *RADIOLOGY REPORT*  Clinical Data: Unresponsive.  Altered mental status.  CT HEAD WITHOUT CONTRAST  Technique:  Contiguous axial images were obtained from the base of the skull through the vertex without contrast.  Comparison: None.  Findings: The brain has a normal appearance without evidence of malformation, atrophy, old or acute infarction, mass lesion, hemorrhage, hydrocephalus or significant extra-axial collection. 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  No skull fracture.  There are some mucosal inflammatory changes of the sinuses, most notable on the right maxillary sinus.  IMPRESSION: Normal appearance of the brain.  Some sinus inflammation, particularly of the right maxillary sinus.   Original Report Authenticated By: Paulina Fusi, M.D.    Dg Chest Port 1 View  11/17/2012  *RADIOLOGY REPORT*  Clinical Data: Evaluate endotracheal tube, shortness of breath  PORTABLE CHEST - 1 VIEW  Comparison: 11/15/2012  Findings:  Grossly unchanged cardiac silhouette and mediastinal contours. Endotracheal tube overlies the tracheal air column with tip superior to the carina.  Interval placement of enteric tube which appears coiled over the gastric fundus. Minimal infrahilar heterogeneous atelectasis.  No focal airspace opacities.  No supine evidence of pneumothorax or pleural effusion.  Grossly unchanged bones.  IMPRESSION: 1.  Appropriately positioned support apparatus as above.  No pneumothorax. 2.  No acute cardiopulmonary disease.   Original Report Authenticated By: Tacey Ruiz, MD    Dg Chest [facility]  11/15/2012  *RADIOLOGY REPORT*  Clinical Data: Respiratory distress.  Status post intubation.  PORTABLE CHEST - 1 VIEW  Comparison: None  Findings: The cardiomediastinal silhouette is unremarkable. An endotracheal tube is identified with tip 4.5 cm above the carina. The lungs are clear. There is no evidence of focal  airspace disease, pulmonary edema, suspicious pulmonary nodule/mass, pleural effusion, or pneumothorax. No acute bony abnormalities are identified.  IMPRESSION: Endotracheal tube placement without evidence of active cardiopulmonary disease.   Original Report Authenticated By: Harmon Pier, M.D.     ASSESSMENT:  1. Elevated troponin c/w NSTEMI most likely stress induced from Heroin overdose. 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 Interestingly his CPK was significantly elevated with a normal MB making acute coronary syndrome unlikely. 2. Heroin overdose  3. Hepatits C  4. Unresponsive secondary to Heroin overdose  5. Hyperkalemia resolved  6. Acute renal failure resolved PLAN:  1. Once recovers from acute overdose will probably need cath - although another option is to repeat echo to see if wall motion abnormality has resolved which would be consistent with Takotsubo CM - give young age unlikely to have underlying CAD 2. Continue IV Lopressor and ASA and IV Heparin gtt 3.  Replete potassium    Quintella Reichert, MD  11/18/2012  7:54 AM

## 2012-11-18 NOTE — Progress Notes (Signed)
ANTICOAGULATION CONSULT NOTE - Follow Up Consult  Pharmacy Consult for Heparin Indication: chest pain/ACS  Patient Measurements: Height: 6' (182.9 cm) Weight: 179 lb 3.7 oz (81.3 kg) IBW/kg (Calculated) : 77.6 Heparin Dosing Weight: 81kg   Labs:  Recent Labs  11/15/12 2125 11/16/12 0144 11/16/12 0415 11/16/12 0730 11/16/12 1730 11/17/12 0423 11/17/12 1509  11/18/12 0130 11/18/12 1028 11/18/12 1921  HGB 14.6  --  13.9  --   --   --   --   --  12.5*  --   --   HCT 43.9  --  41.0  --   --   --   --   --  37.0*  --   --   PLT 145*  --  143*  --   --   --   --   --  131*  --   --   HEPARINUNFRC  --   --   --   --   --   --   --   < > 0.29* 0.24* 0.61  CREATININE 1.18 1.04 0.98  --   --  0.86  --   --  0.85  --   --   CKTOTAL  --  1835*  --   --   --   --  625*  --   --   --   --   CKMB  --   --   --   --   --   --  4.0  --   --   --   --   TROPONINI  --  0.94*  --  0.85* 3.02*  --   --   --   --   --   --   < > = values in this interval not displayed. Estimated Creatinine Clearance: 142 ml/min (by C-G formula based on Cr of 0.85).  Assessment: 26yo M being continued on IV heparin for NSTEMI. HL now at goal.  No complications noted.  Goal of Therapy:  Heparin level 0.3-0.7 units/ml Monitor platelets by anticoagulation protocol: Yes   Plan:  - Continue heparin at 1700 units/hr  - Continue to monitor daily heparin level and CBC - Will monitor signs/symptoms of bleeding  Jill Side L. Illene Bolus, PharmD, BCPS Clinical Pharmacist Pager: [phone] Pharmacy: [phone] 11/18/2012 8:10 PM

## 2012-11-18 NOTE — Progress Notes (Signed)
PULMONARY  / CRITICAL CARE MEDICINE  Name: Austin Nielsen MRN: [id number] DOB: Jul 27, 1986    ADMISSION DATE:  11/15/2012 CHIEF COMPLAINT:  Heroin overdose and respiratory failure.    BRIEF PATIENT DESCRIPTION:  27 years old male with PMH relevant for hepatitis C and drug abuse. Presents brought by EMS after being found unresponsive. He was last seen doing well today at 10:00am. He was found at about 6:30 pm. At admission he was unresponsive and was intubated for airway protection. He had pin point pupils, had some posturing movements as per the ED physicians and was tachycardic and hypertensive. There are multiple needle marks in his right arm. At the time of my exam the patient is intubated, sedated with versed, in sinus tachycardia in the 150's and slightly hypertensive. As per the nurse in the room he was moving all 4 extremities and trying to reach for the ETT with his left hand.    LINES / TUBES: - 3 peripheral IV's - Consent for central line taken in case is needed.  CULTURES: - MRSA PCR - neg - BLood culture 11/15/12 - neg     ANTIBIOTICS: - Zosyn (11/15/12) - Vancomycin (11/15/12) >. 11/17/12    EVENTS 11/15/2012 > admission 11/16/2012: He seems to be gradually waking up 11/17/2012: Increasing your weight but still confused and agitated when off sedation. Echocardiogram with depressed ejection fraction. Cardiac enzymes positive for myocardial infarction. Mom continues to be at bedside and very anxious   SUBJECTIVE/OVERNIGHT/INTERVAL HX 2/26: Did well with SBT. Almost ready for extubation but agitated though follows commands (on wua off diprivan). Cards cnsidering takasubo  VITAL SIGNS: Temp:  [97.6 F (36.4 C)-100.2 F (37.9 C)] 98.8 F (37.1 C) (02/26 1100) Pulse Rate:  [62-96] 78 (02/26 1100) Resp:  [15-17] 17 (02/26 1100) BP: (95-123)/(53-97) 109/69 mmHg (02/26 1100) SpO2:  [99 %-100 %] 99 % (02/26 1100) FiO2 (%):  [40 %] 40 % (02/26 0940) HEMODYNAMICS:    VENTILATOR SETTINGS: Vent Mode:  [-] CPAP;PSV FiO2 (%):  [40 %] 40 % Set Rate:  [16 bmp] 16 bmp Vt Set:  [620 mL] 620 mL PEEP:  [5 cmH20] 5 cmH20 Pressure Support:  [8 cmH20] 8 cmH20 Plateau Pressure:  [16 cmH20-17 cmH20] 17 cmH20 INTAKE / OUTPUT: Intake/Output     02/25 0701 - 02/26 0700 02/26 0701 - 02/27 0700   I.V. (mL/kg) 1095.3 (13.5) 87.1 (1.1)   NG/GT 390 180   IV Piggyback 742.5 37.5   Total Intake(mL/kg) 2227.8 (27.4) 304.6 (3.7)   Urine (mL/kg/hr) 1675 (0.9) 325 (0.9)   Total Output 1675 325   Net +552.8 -20.4          PHYSICAL EXAMINATION: General: Intubated,  Eyes: Anicteric sclerae. ENT: Oropharynx clear. Dry mucous membranes. ETT in place Lymph: No cervical, supraclavicular, or axillary lymphadenopathy. Heart: Normal S1, S2. Tachycardic. No murmurs, rubs, or gallops appreciated. No bruits, equal pulses. Lungs: Normal excursion, no dullness to percussion. Good air movement bilaterally, without wheezes or crackles. Normal upper airway sounds without evidence of stridor. Abdomen: Abdomen soft, non-tender and not distended, normoactive bowel sounds. No hepatosplenomegaly or masses. Musculoskeletal: No clubbing or synovitis. Swollen right hand but warm with good capillary filling. Normal distal pulses in all 4 extremities.  Skin: No rashes. Needle marks in the right antecubital area. Neuro: Agitated on wake up assessment. Moves all fours. Gag present. Pupils equal and reactive to light. Increased following commands.  LABS:  Recent Labs Lab 11/15/12 1839 11/15/12 1903 11/15/12 2125 11/16/12 0144  11/16/12 0415 11/16/12 0439 11/16/12 0730 11/16/12 1730 11/17/12 0423 11/17/12 1509 11/18/12 0130  HGB 15.3  --  14.6  --  13.9  --   --   --   --   --  12.5*  WBC 18.3*  --  17.1*  --  14.5*  --   --   --   --   --  7.5  PLT 194  --  145*  --  143*  --   --   --   --   --  131*  NA 134*  --   --  135 137  --   --   --  143  --  142  K 6.4*  --   --  4.3 4.5  --    --   --  3.4*  --  3.3*  CL 98  --   --  102 104  --   --   --  107  --  106  CO2 22  --   --  25 25  --   --   --  26  --  24  GLUCOSE 199*  --   --  136* 115*  --   --   --  91  --  94  BUN 21  --   --  21 20  --   --   --  10  --  12  CREATININE 1.36*  --  1.18 1.04 0.98  --   --   --  0.86  --  0.85  CALCIUM 8.5  --   --  8.4 8.4  --   --   --  9.0  --  8.8  MG 1.7  --   --   --   --   --   --   --  2.0  --  2.0  PHOS  --   --   --   --  1.9*  --   --   --  1.8*  --  3.7  AST 33  --   --   --   --   --   --   --   --   --   --   ALT 18  --   --   --   --   --   --   --   --   --   --   ALKPHOS 85  --   --   --   --   --   --   --   --   --   --   BILITOT 0.2*  --   --   --   --   --   --   --   --   --   --   PROT 7.3  --   --   --   --   --   --   --   --   --   --   ALBUMIN 3.9  --   --   --   --   --   --   --   --   --   --   LATICACIDVEN 4.6*  --   --   --   --   --   --   --  0.9  --   --   TROPONINI 0.63*  --   --  0.94*  --   --  0.85* 3.02*  --   --   --  PROCALCITON  --   --   --   --   --   --   --   --   --  6.09 4.44  PROBNP  --   --   --   --   --   --   --   --   --   --  2600.0*  PHART  --  7.313*  --   --   --  7.432  --   --   --   --   --   PCO2ART  --  40.8  --   --   --  34.5*  --   --   --   --   --   PO2ART  --  89.0  --   --   --  153.0*  --   --   --   --   --     Recent Labs Lab 11/15/12 2229 11/17/12 1226  GLUCAP 106* 118*    CXR:  - No acute infiltrates. ETT in adequate position.  ASSESSMENT / PLAN: 27 years old male with PMH relevant for hepatitis C and IV drug abuse (heroin). Presents brought by EMS after being found unresponsive. Intubated for airway protection. Urine toxicology screen positive for opiates. CT scan of the head with no acute findings. Slightly elevated troponin. Hyperkalemia (treated), Acute renal failure (creat: 1.36) with mildly elevated CPK (852).   PULMONARY  Recent Labs Lab 11/15/12 1903 11/16/12 0439  PHART 7.313*  7.432  PCO2ART 40.8 34.5*  PO2ART 89.0 153.0*  HCO3 20.7 22.6  TCO2 22 23.6  O2SAT 96.0 99.1    A: 1) Acute respiratory failure due to inability to protect his airway 2) Heroin overdose - 11/17/2012: doing well on SBT but extubation ? Possible due to agitation  P:   - SBT as tolerated. 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  11/18/12: hypokalemia  P:  - KCL repletion   GASTROINTESTINAL  Recent Labs Lab 11/15/12 1839  AST 33  ALT 18  ALKPHOS 85  BILITOT 0.2*  PROT 7.3  ALBUMIN 3.9    A:   1) No issues P:   - GI prophylaxis with protonix - dc  tube feeds hopeful for extubation  HEMATOLOGIC  Recent Labs Lab 11/15/12 2125  11/16/12 0415 11/18/12 0130  HGB 14.6 13.9 12.5*  HCT 43.9 41.0 37.0*  WBC 17.1* 14.5* 7.5  PLT 145* 143* 131*    A:   1) No issues P:  - Will follow CBC; watch platelets while on heparin  INFECTIOUS  Recent Labs Lab 11/15/12 1839 11/17/12 0423 11/17/12 1509 11/18/12 0130  LATICACIDVEN 4.6* 0.9  --   --   PROCALCITON  --   --  6.09 4.44     Recent Labs Lab 11/17/12 1509 11/18/12 0130  PROCALCITON 6.09 4.44    A:   - . HIV-negative 11/16/2012 - 11/18/12: low grade fever and mild high PCT P - Continue Zosyn  - Check pro-calcitonin algorithm to decide stop date of antibiotic     ENDOCRINE A:   1) No issues   NEUROLOGIC A:   1) Heroin overdose 2) Possible anoxic brain injury. No acute findings on head CT. - 11/17/2012: Improving mental status P:   - neurology consultation (mom keen on this) - Use propofol for sedation    Global  - 11/16/2012: Mother and sister updated. Mother  significant spiritual distress due to loss of another sibling or drug problems patient relapsing  with the same since October 2013. Chaplain consult called   - 11/17/2012 and 11/18/12: Mom updated    The patient is critically ill with multiple organ systems failure and requires high complexity decision making for assessment and support, frequent evaluation and titration of therapies, application of advanced monitoring technologies and extensive interpretation of multiple databases.   Critical Care Time devoted to patient care services described in this note is  31  Minutes.      Dr. Kalman Shan, M.D., [facility].C.P Pulmonary and Critical Care Medicine Staff Physician [facility]  Pulmonary and Critical Care Pager: [phone], If no answer or between  15:00h - 7:00h: call [phone]  11/18/2012 11:42 AM

## 2012-11-18 NOTE — Progress Notes (Signed)
ANTICOAGULATION CONSULT NOTE  Pharmacy Consult for Heparin Indication: chest pain/ACS  Patient Measurements: Height: 6' (182.9 cm) Weight: 179 lb 3.7 oz (81.3 kg) IBW/kg (Calculated) : 77.6 Heparin Dosing Weight: 81kg   Labs:  Recent Labs  11/15/12 1932 11/15/12 2125 11/16/12 0144 11/16/12 0415 11/16/12 0730 11/16/12 1730 11/17/12 0423 11/17/12 1509 11/17/12 1819 11/18/12 0130  HGB  --  14.6  --  13.9  --   --   --   --   --  12.5*  HCT  --  43.9  --  41.0  --   --   --   --   --  37.0*  PLT  --  145*  --  143*  --   --   --   --   --  131*  HEPARINUNFRC  --   --   --   --   --   --   --   --  <0.10* 0.29*  CREATININE  --  1.18 1.04 0.98  --   --  0.86  --   --  0.85  CKTOTAL 852*  --  1835*  --   --   --   --  625*  --   --   CKMB  --   --   --   --   --   --   --  4.0  --   --   TROPONINI  --   --  0.94*  --  0.85* 3.02*  --   --   --   --    Estimated Creatinine Clearance: 142 ml/min (by C-G formula based on Cr of 0.85).  Assessment: 27yo Male with NSTEMI for heparin  Goal of Therapy:  Heparin level 0.3-0.7 units/ml Monitor platelets by anticoagulation protocol: Yes   Plan:  Increase Heparin 1500 units/hr  Geannie Risen, PharmD, BCPS  11/18/2012   3:09 AM

## 2012-11-18 NOTE — Progress Notes (Signed)
Clinical Child psychotherapist met with pt, pt's mother, and pt's sister at bedside-Pt remains intubated and unable to fully participate in assessment.  CSW introduced self, explained role, and provided support.  Family stated they are "hopeful" pt will be interested in substance abuse resources once pt is more alert and oriented.  CSW validated feelings and provided emotional support.  CSW to continue to follow and assist as needed.   Angelia Mould, MSW, [place] [phone]

## 2012-11-18 NOTE — Progress Notes (Signed)
NEURO HOSPITALIST PROGRESS NOTE   SUBJECTIVE:                                                                                                                        Austin Nielsen remains intubated and mechanically ventilated. On propofol. No new neurological developments. Seems to be more alert and able to track.  OBJECTIVE:                                                                                                                           Vital signs in last 24 hours: Temp:  [98.3 F (36.8 C)-100.2 F (37.9 C)] 99.5 F (37.5 C) (02/26 0600) Pulse Rate:  [62-105] 66 (02/26 0600) Resp:  [15-19] 16 (02/26 0600) BP: (95-128)/(53-97) 123/77 mmHg (02/26 0600) SpO2:  [99 %-100 %] 100 % (02/26 0600) FiO2 (%):  [40 %] 40 % (02/26 0300)  Intake/Output from previous day: 02/25 0701 - 02/26 0700 In: 2138.2 [I.V.:1063.2; NG/GT:345; IV Piggyback:730] Out: 1675 [Urine:1675] Intake/Output this shift:   Nutritional status: NPO  Past Medical History  Diagnosis Date  . Hepatitis C     Neurologic ROS negative with exception of above.   Neurologic Exam:  Mental status: he is on propofol, but is able to open eyes spontaneously and tries to follow commands. Cranial nerves 2-12: pupils 4 mm bilaterally, reactive to light. No gaze preference. No papilledema. EOM full without nystagmus. Face is symmetric. Tongue: intybated. Motor: moves all limbs spontaneously and symmetrically. Sensory: reacts to pain. DTR's: 2+ all over. Plantars: downgoing. Coordination and gait: unable to test. No meningeal irritation signs.     Lab Results: No results found for this basename: cbc, bmp, coags, chol, tri, ldl, hga1c   Lipid Panel No results found for this basename: CHOL, TRIG, HDL, CHOLHDL, VLDL, LDLCALC,  in the last 72 hours  Studies/Results: Dg Chest Port 1 View  11/17/2012  *RADIOLOGY REPORT*  Clinical Data: Evaluate endotracheal tube, shortness of  breath  PORTABLE CHEST - 1 VIEW  Comparison: 11/15/2012  Findings:  Grossly unchanged cardiac silhouette and mediastinal contours. Endotracheal tube overlies the tracheal air column with tip superior to the carina.  Interval placement of enteric tube which appears coiled over the gastric fundus. Minimal infrahilar  heterogeneous atelectasis.  No focal airspace opacities.  No supine evidence of pneumothorax or pleural effusion.  Grossly unchanged bones.  IMPRESSION: 1.  Appropriately positioned support apparatus as above.  No pneumothorax. 2.  No acute cardiopulmonary disease.   Original Report Authenticated By: Tacey Ruiz, MD     MEDICATIONS                                                                                                                       I have reviewed the patient's current medications.  ASSESSMENT/PLAN:                                                                                                           Probable agitated delirium (improving), narcotic withdrawal. No obvious lateralizing neurological signs and he seems to be more alert and awake today. Will continue current treatment and pursue neurological testing if no substantial neurological improvement/ Will follow up with you.  Wyatt Portela, MD [facility] [phone]  11/18/2012, 7:53 AM

## 2012-11-19 LAB — PROCALCITONIN: Procalcitonin: <0.1 ng/mL

## 2012-11-19 LAB — CBC
MCV: 92.1 fL (ref 78.0–100.0)
Platelets: 134 10*3/uL — ABNORMAL LOW (ref 150–400)
RDW: 11.9 % (ref 11.5–15.5)
WBC: 8.1 10*3/uL (ref 4.0–10.5)

## 2012-11-19 LAB — BASIC METABOLIC PANEL
CO2: 22 mEq/L (ref 19–32)
Calcium: 9.1 mg/dL (ref 8.4–10.5)
Creatinine, Ser: 0.87 mg/dL (ref 0.50–1.35)
GFR calc Af Amer: >90 mL/min (ref >90)

## 2012-11-19 LAB — MAGNESIUM: Magnesium: 2 mg/dL (ref 1.5–2.5)

## 2012-11-19 LAB — PHOSPHORUS: Phosphorus: 3.8 mg/dL (ref 2.3–4.6)

## 2012-11-19 MED ORDER — SODIUM CHLORIDE 0.9 % IV SOLN
1000.0000 mL | Freq: Once | INTRAVENOUS | Status: AC
  Administered 2012-11-19: 250 mL via INTRAVENOUS

## 2012-11-19 MED ORDER — DEXTROSE 5 % IV SOLN
INTRAVENOUS | Status: DC
  Administered 2012-11-19: 11:00:00 via INTRAVENOUS

## 2012-11-19 MED ORDER — SODIUM CHLORIDE 0.9 % IV SOLN: 1000.0000 mL | Freq: Once | INTRAVENOUS | Status: DC

## 2012-11-19 MED ORDER — FENTANYL CITRATE 0.05 MG/ML IJ SOLN
12.5000 ug | INTRAMUSCULAR | Status: DC | PRN
  Administered 2012-11-19: 25 ug via INTRAVENOUS
  Administered 2012-11-19: 12.5 ug via INTRAVENOUS
  Administered 2012-11-20 (×2): 25 ug via INTRAVENOUS
  Filled 2012-11-19 (×4): qty 2

## 2012-11-19 NOTE — Progress Notes (Addendum)
SUBJECTIVE:  Intubated and sedated  OBJECTIVE:   Vitals:   Filed Vitals:   11/19/12 0600 11/19/12 0700 11/19/12 0800 11/19/12 0804  BP: 105/62 106/52 106/54   Pulse: 54 50 50 55  Temp: 100.1 F (37.8 C) 100.3 F (37.9 C) 100.4 F (38 C)   TempSrc:  Core (Comment)    Resp: 16 16 16 20   Height:      Weight:      SpO2: 100% 100% 100% 100%   I&O's:   Intake/Output Summary (Last 24 hours) at 11/19/12 0843 Last data filed at 11/19/12 0800  Gross per 24 hour  Intake 1557.4 ml  Output   1513 ml  Net   44.4 ml   TELEMETRY: Reviewed telemetry pt in NSR     PHYSICAL EXAM General: Well developed, well nourished, in no acute distress Head: Eyes PERRLA, No xanthomas.   Normal cephalic and atramatic  Lungs:   Clear bilaterally to auscultation and percussion. Heart:   HRRR S1 S2 Pulses are 2+ & equal. Abdomen: Bowel sounds are positive, abdomen soft and non-tender without masses Extremities:   No clubbing, cyanosis or edema.  DP +1     LABS: Basic Metabolic Panel:  Recent Labs  98/11/91 0130 11/19/12 0440  NA 142 145  K 3.3* 3.8  CL 106 110  CO2 24 22  GLUCOSE 94 107*  BUN 12 17  CREATININE 0.85 0.87  CALCIUM 8.8 9.1  MG 2.0 2.0  PHOS 3.7 3.8   Liver Function Tests: No results found for this basename: AST, ALT, ALKPHOS, BILITOT, PROT, ALBUMIN,  in the last 72 hours No results found for this basename: LIPASE, AMYLASE,  in the last 72 hours CBC:  Recent Labs  11/18/12 0130 11/19/12 0440  WBC 7.5 8.1  HGB 12.5* 12.4*  HCT 37.0* 36.3*  MCV 92.5 92.1  PLT 131* 134*   Cardiac Enzymes:  Recent Labs  11/16/12 1730 11/17/12 1509  CKTOTAL  --  625*  CKMB  --  4.0  TROPONINI 3.02*  --      RADIOLOGY: Ct Head Wo Contrast  11/15/2012  *RADIOLOGY REPORT*  Clinical Data: Unresponsive.  Altered mental status.  CT HEAD WITHOUT CONTRAST  Technique:  Contiguous axial images were obtained from the base of the skull through the vertex without contrast.  Comparison:  None.  Findings: The brain has a normal appearance without evidence of malformation, atrophy, old or acute infarction, mass lesion, hemorrhage, hydrocephalus or significant extra-axial collection. 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  No skull fracture.  There are some mucosal inflammatory changes of the sinuses, most notable on the right maxillary sinus.  IMPRESSION: Normal appearance of the brain.  Some sinus inflammation, particularly of the right maxillary sinus.   Original Report Authenticated By: Paulina Fusi, M.D.    Dg Chest Port 1 View  11/17/2012  *RADIOLOGY REPORT*  Clinical Data: Evaluate endotracheal tube, shortness of breath  PORTABLE CHEST - 1 VIEW  Comparison: 11/15/2012  Findings:  Grossly unchanged cardiac silhouette and mediastinal contours. Endotracheal tube overlies the tracheal air column with tip superior to the carina.  Interval placement of enteric tube which appears coiled over the gastric fundus. Minimal infrahilar heterogeneous atelectasis.  No focal airspace opacities.  No supine evidence of pneumothorax or pleural effusion.  Grossly unchanged bones.  IMPRESSION: 1.  Appropriately positioned support apparatus as above.  No pneumothorax. 2.  No acute cardiopulmonary disease.   Original Report Authenticated By:  Tacey Ruiz, MD    Dg Chest [facility]  11/15/2012  *RADIOLOGY REPORT*  Clinical Data: Respiratory distress.  Status post intubation.  PORTABLE CHEST - 1 VIEW  Comparison: None  Findings: The cardiomediastinal silhouette is unremarkable. An endotracheal tube is identified with tip 4.5 cm above the carina. The lungs are clear. There is no evidence of focal airspace disease, pulmonary edema, suspicious pulmonary nodule/mass, pleural effusion, or pneumothorax. No acute bony abnormalities are identified.  IMPRESSION: Endotracheal tube placement without evidence of active cardiopulmonary disease.   Original Report Authenticated By:  Harmon Pier, M.D.     ASSESSMENT:  1. Elevated troponin c/w NSTEMI most likely stress induced from Heroin overdose. 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 Interestingly his CPK was significantly elevated with a normal MB making acute coronary syndrome unlikely.  2. Heroin overdose  3. Hepatits C   4. Acute renal failure resolved - now with oliguria so giving IVF PLAN:  1. Once recovers from acute overdose will probably need cath - although another option is to repeat echo to see if wall motion abnormality has resolved which would be consistent with Takotsubo CM - give young age unlikely to have underlying CAD  2. Continue IV Lopressor and ASA and IV Heparin gtt  3. Cautious use of IVF in setting of DCM for oliguria      Quintella Reichert, MD  11/19/2012  8:43 AM

## 2012-11-19 NOTE — Progress Notes (Signed)
ANTICOAGULATION CONSULT NOTE - Follow Up Consult  Pharmacy Consult for Heparin Indication: chest pain/ACS  Patient Measurements: Height: 6' (182.9 cm) Weight: 169 lb 15.6 oz (77.1 kg) IBW/kg (Calculated) : 77.6 Heparin Dosing Weight: 81kg   Labs:  Recent Labs  11/16/12 1730 11/17/12 0423 11/17/12 1509  11/18/12 0130 11/18/12 1028 11/18/12 1921 11/19/12 0440  HGB  --   --   --   --  12.5*  --   --  12.4*  HCT  --   --   --   --  37.0*  --   --  36.3*  PLT  --   --   --   --  131*  --   --  134*  HEPARINUNFRC  --   --   --   < > 0.29* 0.24* 0.61 0.43  CREATININE  --  0.86  --   --  0.85  --   --  0.87  CKTOTAL  --   --  625*  --   --   --   --   --   CKMB  --   --  4.0  --   --   --   --   --   TROPONINI 3.02*  --   --   --   --   --   --   --   < > = values in this interval not displayed. Estimated Creatinine Clearance: 137.9 ml/min (by C-G formula based on Cr of 0.87).  Assessment: 27yo M being continued on IV heparin for NSTEMI. HL now at goal.  No issues. Cont heparin for now  Goal of Therapy:  Heparin level 0.3-0.7 units/ml Monitor platelets by anticoagulation protocol: Yes   Plan:  - Continue heparin at 1700 units/hr  - Continue to monitor daily heparin level and CBC - Will monitor signs/symptoms of bleeding

## 2012-11-19 NOTE — Procedures (Signed)
Extubation Procedure Note  Patient Details:   Name: Austin Nielsen DOB: 23-Mar-1986 MRN: [id number]   Airway Documentation:     Evaluation  O2 sats: stable throughout Complications: No apparent complications Patient did tolerate procedure well. Bilateral Breath Sounds: Clear Suctioning: Airway Yes  Patient extubated and placed on 2LNC. Patient has clear/diminished bilateral breathsounds. No stridor is present HR-100 97% 87/51.   [name] 11/19/2012, 10:49 AM

## 2012-11-19 NOTE — Progress Notes (Signed)
PULMONARY  / CRITICAL CARE MEDICINE  Name: Austin Nielsen MRN: [id number] DOB: [birth year]    ADMISSION DATE:  11/15/2012 CHIEF COMPLAINT:  Heroin overdose and respiratory failure.    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 Presents brought by EMS after being found unresponsive. On arrival to ED, he was unresponsive and was intubated for airway protection.  UDS positive for opiates  COURSE/EVENTS/STUDIES 2/23 admitted for depressed LOC, drug OD 2/23 CT head: NAD 2/24 elevated troponin I (peak 3.02) 2/24 Echo: Systolic function was mildly to moderately reduced. The estimated ejection fraction was in the range of 40% to 45%. There is hypokinesis of the apical myocardium. 2/25 Cards consult: metoprolol, ASA, IV heparin. Plan cath when stabilized 2/25 Neurology consult: W/D syndrome with agitated delirium. Consider MRI/EEG if not improving after extubation 2/27 Extubated. Tolerating well. Cognition improved   LINES / TUBES: ETT 2/23 >> 2/27   CULTURES: MRSA PCR 2/23 >> NEG Blood 2/23 >> NEG HIV 2/24 >> NEG  ANTIBIOTICS: - Zosyn (11/15/12) >> 2/27 - Vancomycin (11/15/12) >> 11/17/12   SUBJECTIVE/OVERNIGHT/INTERVAL HX RASS 0. + F/C. Passed SBT. Extubated and looks good  VITAL SIGNS: Temp:  [98.4 F (36.9 C)-100.4 F (38 C)] 98.6 F (37 C) (02/27 1500) Pulse Rate:  [50-101] 96 (02/27 1500) Resp:  [15-22] 20 (02/27 1500) BP: (75-128)/(27-68) 128/58 mmHg (02/27 1500) SpO2:  [95 %-100 %] 97 % (02/27 1500) FiO2 (%):  [40 %] 40 % (02/27 0832) Weight:  [77.1 kg (169 lb 15.6 oz)] 77.1 kg (169 lb 15.6 oz) (02/27 0400) HEMODYNAMICS:   VENTILATOR SETTINGS: Vent Mode:  [-] PSV;CPAP FiO2 (%):  [40 %] 40 % Set Rate:  [16 bmp] 16 bmp Vt Set:  [620 mL] 620 mL PEEP:  [5 cmH20] 5 cmH20 Pressure Support:  [5 cmH20-10 cmH20] 5 cmH20 Plateau Pressure:  [16 cmH20] 16 cmH20 INTAKE / OUTPUT: Intake/Output     02/26 0701 - 02/27 0700 02/27 0701 -  02/28 0700   I.V. (mL/kg) 1178.2 (15.3) 218.8 (2.8)   Other 60    NG/GT 210    IV Piggyback 162.5 12.5   Total Intake(mL/kg) 1610.7 (20.9) 231.3 (3)   Urine (mL/kg/hr) 1685 (0.9) 315 (0.4)   Stool 3 (0)    Total Output 1688 315   Net -77.3 -83.7        Stool Occurrence  3 x     PHYSICAL EXAMINATION: General: NAD HEENT: WNL Neck: no JVD Heart: RRR s M Lungs: clear anteriorly Abdomen: Soft, NT, NABS Ext: no C/C/E Skin: Needle marks in the right antecubital area. Neuro: no focal deficits. Cognition appears intact  LABS: BMET    Component Value Date/Time   NA 145 11/19/2012 0440   K 3.8 11/19/2012 0440   CL 110 11/19/2012 0440   CO2 22 11/19/2012 0440   GLUCOSE 107* 11/19/2012 0440   BUN 17 11/19/2012 0440   CREATININE 0.87 11/19/2012 0440   CALCIUM 9.1 11/19/2012 0440   GFRNONAA >90 11/19/2012 0440   GFRAA >90 11/19/2012 0440    CBC    Component Value Date/Time   WBC 8.1 11/19/2012 0440   RBC 3.94* 11/19/2012 0440   HGB 12.4* 11/19/2012 0440   HCT 36.3* 11/19/2012 0440   PLT 134* 11/19/2012 0440   MCV 92.1 11/19/2012 0440   MCH 31.5 11/19/2012 0440   MCHC 34.2 11/19/2012 0440   RDW 11.9 11/19/2012 0440   LYMPHSABS 1.7 11/15/2012 1839   MONOABS 1.3*  11/15/2012 1839   EOSABS 0.0 11/15/2012 1839   BASOSABS 0.0 11/15/2012 1839      CXR:  No new film  ASSESSMENT / PLAN:   PULMONARY A: Acute respiratory failure due to depressed LOC, resolved  P:   Monitor in ICU post extubation   CARDIOVASCULAR A:  1) 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 Resolved 2/27 Mild hypernatremia P:  IVFs adjusted   GASTROINTESTINAL A:   1) No issues P:   NPO post extubation Advance diet when able No indication for SUP post extubation   HEMATOLOGIC A:   1) No issues P:   Monitor   INFECTIOUS A:   No evidence of acute infectious process P D/C Zosyn    NEUROLOGIC A:   AMS due to Heroin overdose, resolved Possible anoxic brain injury. No acute findings on head CT.  -Improving P:   Cont to monitor   CCM X 35 mins   Billy Fischer, MD ; [facility] service Mobile [phone].  After 5:30 PM or weekends, call [phone]

## 2012-11-19 NOTE — Progress Notes (Signed)
Called Dr Aviva Signs regarding decreased urine output. Orders received.

## 2012-11-19 NOTE — Progress Notes (Addendum)
We are called from nurse that pt UOP has been decreasing to be minimal for the past 2 hours. Foley flushed showed patency. Pt Tube feeds were stopped during day yesterday and is still intubated. Pt also is KVO. Vitals are stable.  A: Pt with possibleTakasubo cardiomyopathy with systolic heart failure now with oliguria. No clinical signs of fluid oveload.  P:  Since tube feeds stopped and pt has minimal IV in's will order gentle 250 ml NS and reassess UOP.

## 2012-11-19 NOTE — Progress Notes (Signed)
UR Completed.  Vangie Bicker [phone] 11/19/2012

## 2012-11-19 NOTE — Progress Notes (Signed)
Bladder scan complete. 0 volume. Two nurses checked

## 2012-11-20 DIAGNOSIS — F1120 Opioid dependence, uncomplicated: Secondary | ICD-10-CM

## 2012-11-20 DIAGNOSIS — F1010 Alcohol abuse, uncomplicated: Secondary | ICD-10-CM

## 2012-11-20 LAB — BASIC METABOLIC PANEL
BUN: 14 mg/dL (ref 6–23)
CO2: 24 mEq/L (ref 19–32)
GFR calc non Af Amer: >90 mL/min (ref >90)
Glucose, Bld: 83 mg/dL (ref 70–99)
Potassium: 3.3 mEq/L — ABNORMAL LOW (ref 3.5–5.1)
Sodium: 143 mEq/L (ref 135–145)

## 2012-11-20 LAB — CBC
HCT: 36.9 % — ABNORMAL LOW (ref 39.0–52.0)
Hemoglobin: 12.8 g/dL — ABNORMAL LOW (ref 13.0–17.0)
MCV: 92 fL (ref 78.0–100.0)
RDW: 11.8 % (ref 11.5–15.5)
WBC: 6.3 10*3/uL (ref 4.0–10.5)

## 2012-11-20 MED ORDER — POTASSIUM CHLORIDE CRYS ER 20 MEQ PO TBCR
40.0000 meq | EXTENDED_RELEASE_TABLET | Freq: Once | ORAL | Status: AC
  Administered 2012-11-20: 40 meq via ORAL

## 2012-11-20 MED ORDER — METOPROLOL TARTRATE 12.5 MG HALF TABLET
12.5000 mg | ORAL_TABLET | Freq: Two times a day (BID) | ORAL | Status: DC
  Administered 2012-11-20 – 2012-11-24 (×9): 12.5 mg via ORAL
  Filled 2012-11-20 (×11): qty 1

## 2012-11-20 MED ORDER — FENTANYL CITRATE 0.05 MG/ML IJ SOLN
12.5000 ug | Freq: Four times a day (QID) | INTRAMUSCULAR | Status: DC | PRN
  Administered 2012-11-20 – 2012-11-21 (×3): 12.5 ug via INTRAVENOUS
  Filled 2012-11-20 (×2): qty 2

## 2012-11-20 MED ORDER — POTASSIUM CHLORIDE 20 MEQ/15ML (10%) PO LIQD: 40.0000 meq | Freq: Once | ORAL | Status: DC

## 2012-11-20 MED ORDER — POTASSIUM CHLORIDE CRYS ER 20 MEQ PO TBCR
EXTENDED_RELEASE_TABLET | ORAL | Status: AC
  Filled 2012-11-20: qty 2

## 2012-11-20 MED ORDER — BOOST / RESOURCE BREEZE PO LIQD
1.0000 | Freq: Three times a day (TID) | ORAL | Status: DC
  Administered 2012-11-20 – 2012-11-24 (×6): 1 via ORAL

## 2012-11-20 NOTE — Consult Note (Signed)
Patient Identification:  Austin Nielsen Date of Evaluation:  11/20/2012 Reason for Consult:  Heroin Overdose  Referring Provider:  Dr. Mamie Nick  History of Present Illness:This 27 yo male was found unresponsive in evening 1/23/214 and was intubated.   He took an overdose of cocaine, amount unknown.  Today, he is awake, thinking and speaking slowly.  He gives permission for mother, Austin Nielsen [phone] to remain during this interview  Past Psychiatric History: He drinks beer, 12 pack occasionally.  He has used heroin after he graduated from high school.  He has shared needles  He has had another overdose of heroin.  He has used oxycodone, Roxy  Past Medical History:     Past Medical History  Diagnosis Date  . Hepatitis C       History reviewed. No pertinent past surgical history.  Allergies: No Known Allergies  Current Medications:  Prior to Admission medications   Medication Sig Start Date End Date Taking? Authorizing Provider  Pseudoephedrine-Ibuprofen (ADVIL COLD/SINUS PO) Take 1 tablet by mouth as needed (for cold symptoms).   Yes Historical Provider, MD    Social History:    reports that he has been smoking Cigarettes.  He has been smoking about 1.00 pack per day. He does not have any smokeless tobacco history on file. He reports that  drinks alcohol. He reports that he uses illicit drugs.   Family History:    History reviewed. No pertinent family history.  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  Alcohol abuse,   AXIS II  Deferred  AXIS III See medical notes.  AXIS IV economic problems, occupational problems, other psychosocial or environmental problems, problems related to legal system/crime, problems related to social environment, problems with access to health care services and Pt has repeated events of overdose, now heart attack  AXIS V 31-40 impairment in reality testing   Assessment/Plan:  Discussed with Dr. Marchelle Gearing, Discussed with Pscy CSW Pt has had two overdose events with heroin, who knows what else, and now has had a heart attack Pt is not suicidal but has not gone to a rehab program.  He wants to go to rehab after this hospitalization.  He declares he has no problems, has no car, has no job.    The family of mother 3 brothers and sisters moved from [facility] to [place] to care for their 93 yo grandmother. Prior to leaving [place], pt had just completed a welding course.  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  He has worked at odd jobs, Holiday representative, on Product/process development scientist.   RECOMMENDATION:  1.  Pt does not have capacity, will evaluate again on Monday.  2.  Pt is willing to go to rehab; suggest Psych CSW explore programs for transfer when medically stable. 3.  Consider trazodone 50 mg for sleep with repeat X 1 prn 4.  Will follow Natalye Kott MD 11/20/2012 10:58 AM

## 2012-11-20 NOTE — Progress Notes (Signed)
Pt okay to be transferred to unit 5500 off of telemetry per Dr Marchelle Gearing.

## 2012-11-20 NOTE — Progress Notes (Signed)
Clinical Social Work Department CLINICAL SOCIAL WORK PSYCHIATRY SERVICE LINE ASSESSMENT 11/20/2012  Patient:  Austin Nielsen [facility]  Account:  [id number]  Admit Date:  11/15/2012  Clinical Social Worker:  Unk Lightning, LCSW  Date/Time:  11/20/2012 02:00 PM Referred by:  Physician  Date referred:  11/20/2012 Reason for Referral  Behavioral Health Issues   Presenting Symptoms/Problems (In the person's/family's own words):   Psych consulted due to patient admitted for heroin overdose.   Abuse/Neglect/Trauma History (check all that apply)  Denies history   Abuse/Neglect/Trauma Comments:   Psychiatric History (check all that apply)  Denies history   Psychiatric medications:  None   Current Mental Health Hospitalizations/Previous Mental Health History:   Patient denies any MH concerns.   Current provider:   N/A   Place and Date:   N/A   Current Medications:   acetaminophen (TYLENOL) oral liquid 160 mg/5 mL, fentaNYL            . antiseptic oral rinse  15 mL Mouth Rinse QID  . aspirin  81 mg Oral Daily  . chlorhexidine  15 mL Mouth Rinse BID  . feeding supplement  1 Container Oral TID BM  . metoprolol tartrate  12.5 mg Oral BID  . potassium chloride SA       Previous Impatient Admission/Date/Reason:   None reported   Emotional Health / Current Symptoms    Suicide/Self Harm  None reported   Suicide attempt in the past:   Patient denies any SI or HI. Patient reports that overdose was not a suicide attempt. Patient reports no attempts in the past.   Other harmful behavior:   Patient substance abuser   Psychotic/Dissociative Symptoms  None reported   Other Psychotic/Dissociative Symptoms:    Attention/Behavioral Symptoms  Withdrawn   Other Attention / Behavioral Symptoms:    Cognitive Impairment  Within Normal Limits   Other Cognitive Impairment:    Mood and Adjustment  Flat    Stress, Anxiety, Trauma, Any Recent Loss/Stressor  Current Legal  Problems/Pending Court Date   Anxiety (frequency):   Phobia (specify):   Compulsive behavior (specify):   Obsessive behavior (specify):   Other:   Patient has upcoming court date in March for DUI charge   Substance Abuse/Use  Current substance use  Substance abuse treatment needed   SBIRT completed (please refer for detailed history):  Y  Self-reported substance use:   Patient reports that he has been using heroin about once a week for the past month. Patient denies all other substance use including alcohol but has an upcoming court date for DUI. Patient was sober from October 2013 until about 1 month ago.   Urinary Drug Screen Completed:  Y Alcohol level:   Opiate positive    Environmental/Housing/Living Arrangement  With Biological Parent(s)   Who is in the home:   Mom   Emergency contact:  Sue-mom   Financial  IPRS   Patient's Strengths and Goals (patient's own words):   Patient reports that mom and family are supportive of him.   Clinical Social Worker's Interpretive Summary:   CSW received referral to complete psychosocial assessment. CSW reviewed chart and met with patient and mom at bedside. Patient agreeable to mom involvement during assessment.    CSW introduced myself and explained role. Patient had staffed case with psych MD who reports that patient was interested in rehab for SA. Patient reports that he lives with mom and they recently moved to [facility] from [facility] to be closer to family.  Patient is not working. 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    Patient reports that he has been using heroin for a few years. Patient reports he went to jail and was sober in jail and maintained sobriety when he was released in October 2013. Patient reports he was sober for about 5 months and then relapsed. Patient is unable to identify any triggers or any emotional factors related to use. Patient has been attending NA meetings but only attends 1  every week. Patient admits that last use was on day of admission when he blacked out in car. Patient reports he does not remember many details from that day and does not black out often. Patient denies that overdose was a suicide attempt and reports that he used too much heroin on accident.    Patient denies any MH concerns and denies and MH connections to SA. Patient has never received formal treatment in the past for SA. Patient reports that he wants treatment but is unsure of resources. CSW explained outpatient, intensive outpatient (IOP) and inpatient resources. Patient desires outpatient treatment at this time. CSW had blunt discussion with patient and mom regarding patient's ability to stay sober at home. CSW encouraged mom and patient to discuss changes that would need to occur at home for patient to be successful such as placing boundaries and mom being responsible for patient's money and curfews. Mom reports that she feels patient needs inpatient treatment in order to be successful.    CSW explained that patient could be put on inpatient waiting lists and that if he changed his mind then outpatient could be arranged. Mom and patient agreed to take time to discuss options privately and CSW will follow up to determine if patient agreeable to inpatient treatment.    Patient engaged throughout assessment with some delays. Mom reports that patient has had difficulty remembering time since overdose. Patient agreeable to treatment but appears to minimize overdose and substance abuse. Patient struggles with identifying triggers or coping skills and could benefit from treatment. Patient agreeable to CSW follow up.   Disposition:  Recommend Psych CSW continuing to support while in hospital

## 2012-11-20 NOTE — Progress Notes (Signed)
Clinical Social Work  Patient on waiting list at [facility], If no other options, can go to [facility] on 3/13 at 800 for assessment. Patient would need to bring 30 day supply of all medications at admissions. CSW explained progress to patient and will continue to follow.  [place], [place] [phone]

## 2012-11-20 NOTE — Progress Notes (Signed)
Clinical Social Worker met with pt and pt's mother at bedside.  CSW introduced self, explained role, and provided support.  CSW utilized motivation interviewing techniques with pt to assist pt with identifying inherent strengths and goals.  Pt currently unable to fully verbalize his goals; CSW encouraged pt to take time reflecting on goals as well as strengths and barriers.  CSW to continue to follow and assist as needed.   Angelia Mould, MSW, [place] [phone]

## 2012-11-20 NOTE — Progress Notes (Signed)
SUBJECTIVE:  Now extubated and awake  OBJECTIVE:   Vitals:   Filed Vitals:   11/20/12 0300 11/20/12 0400 11/20/12 0500 11/20/12 0600  BP: 112/60 116/76 107/56 118/58  Pulse: 55 46 53   Temp:      TempSrc:      Resp: 19 17 18 16   Height:      Weight:      SpO2: 81% 94% 96%    I&O's:   Intake/Output Summary (Last 24 hours) at 11/20/12 1191 Last data filed at 11/20/12 0700  Gross per 24 hour  Intake 1143.27 ml  Output    715 ml  Net 428.27 ml   TELEMETRY: Reviewed telemetry pt in NSR:     PHYSICAL EXAM General: Well developed, well nourished, in no acute distress Head: Eyes PERRLA, No xanthomas.   Normal cephalic and atramatic  Lungs:   Clear bilaterally to auscultation and percussion. Heart:   HRRR S1 S2 Pulses are 2+ & equal. Abdomen: Bowel sounds are positive, abdomen soft and non-tender without masses  Extremities:   No clubbing, cyanosis or edema.  DP +1   LABS: Basic Metabolic Panel:  Recent Labs  47/82/95 0130 11/19/12 0440 11/20/12 0431  NA 142 145 143  K 3.3* 3.8 3.3*  CL 106 110 108  CO2 24 22 24   GLUCOSE 94 107* 83  BUN 12 17 14   CREATININE 0.85 0.87 0.75  CALCIUM 8.8 9.1 9.1  MG 2.0 2.0  --   PHOS 3.7 3.8  --    Liver Function Tests: No results found for this basename: AST, ALT, ALKPHOS, BILITOT, PROT, ALBUMIN,  in the last 72 hours No results found for this basename: LIPASE, AMYLASE,  in the last 72 hours CBC:  Recent Labs  11/19/12 0440 11/20/12 0431  WBC 8.1 6.3  HGB 12.4* 12.8*  HCT 36.3* 36.9*  MCV 92.1 92.0  PLT 134* 141*   Cardiac Enzymes:  Recent Labs  11/17/12 1509  CKTOTAL 625*  CKMB 4.0     RADIOLOGY: Ct Head Wo Contrast  11/15/2012  *RADIOLOGY REPORT*  Clinical Data: Unresponsive.  Altered mental status.  CT HEAD WITHOUT CONTRAST  Technique:  Contiguous axial images were obtained from the base of the skull through the vertex without contrast.  Comparison: None.  Findings: The brain has a normal appearance without  evidence of malformation, atrophy, old or acute infarction, mass lesion, hemorrhage, hydrocephalus or significant extra-axial collection. 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  No skull fracture.  There are some mucosal inflammatory changes of the sinuses, most notable on the right maxillary sinus.  IMPRESSION: Normal appearance of the brain.  Some sinus inflammation, particularly of the right maxillary sinus.   Original Report Authenticated By: Paulina Fusi, M.D.    Dg Chest Port 1 View  11/17/2012  *RADIOLOGY REPORT*  Clinical Data: Evaluate endotracheal tube, shortness of breath  PORTABLE CHEST - 1 VIEW  Comparison: 11/15/2012  Findings:  Grossly unchanged cardiac silhouette and mediastinal contours. Endotracheal tube overlies the tracheal air column with tip superior to the carina.  Interval placement of enteric tube which appears coiled over the gastric fundus. Minimal infrahilar heterogeneous atelectasis.  No focal airspace opacities.  No supine evidence of pneumothorax or pleural effusion.  Grossly unchanged bones.  IMPRESSION: 1.  Appropriately positioned support apparatus as above.  No pneumothorax. 2.  No acute cardiopulmonary disease.   Original Report Authenticated By: Tacey Ruiz, MD    Dg Chest  Port 1 View  11/15/2012  *RADIOLOGY REPORT*  Clinical Data: Respiratory distress.  Status post intubation.  PORTABLE CHEST - 1 VIEW  Comparison: None  Findings: The cardiomediastinal silhouette is unremarkable. An endotracheal tube is identified with tip 4.5 cm above the carina. The lungs are clear. There is no evidence of focal airspace disease, pulmonary edema, suspicious pulmonary nodule/mass, pleural effusion, or pneumothorax. No acute bony abnormalities are identified.  IMPRESSION: Endotracheal tube placement without evidence of active cardiopulmonary disease.   Original Report Authenticated By: Harmon Pier, M.D.    ASSESSMENT:  1. Elevated troponin c/w  NSTEMI most likely stress induced from Heroin overdose. 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 Interestingly his CPK was significantly elevated with a normal MB making acute coronary syndrome unlikely.  2. Heroin overdose  3. Hepatits C  4. Acute renal failure resolved  5. Hypokalemia  PLAN:  1. Once recovers from acute overdose will probably need cath - although another option is to repeat echo to see if wall motion abnormality has resolved which would be consistent with Takotsubo CM - give young age unlikely to have underlying CAD  2. D/C IV Heparin gtt 3. Change IV lopressor to PO 4. Replete potassium       Quintella Reichert, MD  11/20/2012  7:52 AM

## 2012-11-20 NOTE — Progress Notes (Signed)
NUTRITION FOLLOW UP  Intervention:    Resource Breeze PO TID, each supplement provides 250 kcal and 9 grams of protein.  Nutrition Dx:   Inadequate oral intake related to poor appetite as evidenced by poor intake of meals.  Ongoing.  Goal:   Intake to meet >90% of estimated nutrition needs.  Monitor:   PO intake, labs, weight trend.  Assessment:   Patient was extubated 2/27.  Appetite is "so-so" per patient.  Agreed to try [employer] supplement to increase oral intake.   Height: Ht Readings from Last 1 Encounters:  11/15/12 6' (1.829 m)    Weight Status:   Wt Readings from Last 1 Encounters:  11/19/12 169 lb 15.6 oz (77.1 kg)  11/15/12  179 lb 3.7 oz (81.3 kg)    Re-estimated needs:  Kcal: 2200-2400 Protein: 110-125 gm Fluid: 2.2-2.4 L  Skin: no problems noted  Diet Order: Cardiac   Intake/Output Summary (Last 24 hours) at 11/20/12 1110 Last data filed at 11/20/12 0900  Gross per 24 hour  Intake   1140 ml  Output    650 ml  Net    490 ml    Last BM: 2/28   Labs:   Recent Labs Lab 11/17/12 0423 11/18/12 0130 11/19/12 0440 11/20/12 0431  NA 143 142 145 143  K 3.4* 3.3* 3.8 3.3*  CL 107 106 110 108  CO2 26 24 22 24   BUN 10 12 17 14   CREATININE 0.86 0.85 0.87 0.75  CALCIUM 9.0 8.8 9.1 9.1  MG 2.0 2.0 2.0  --   PHOS 1.8* 3.7 3.8  --   GLUCOSE 91 94 107* 83    CBG (last 3)   Recent Labs  11/17/12 1226  GLUCAP 118*    Scheduled Meds: . sodium chloride   Intravenous Once  . antiseptic oral rinse  15 mL Mouth Rinse QID  . aspirin  81 mg Oral Daily  . chlorhexidine  15 mL Mouth Rinse BID  . metoprolol tartrate  12.5 mg Oral BID  . potassium chloride SA        Continuous Infusions: . dextrose 40 mL/hr at 11/19/12 1045    Joaquin Courts, RD, LDN, CNSC Pager# [phone] After Hours Pager# [phone]

## 2012-11-20 NOTE — Progress Notes (Signed)
PULMONARY  / CRITICAL CARE MEDICINE  Name: Austin Nielsen MRN: [id number] DOB: 1986/04/21    ADMISSION DATE:  11/15/2012 CHIEF COMPLAINT:  Heroin overdose and respiratory failure.    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 Presents brought by EMS after being found unresponsive. On arrival to ED, he was unresponsive and was intubated for airway protection.  UDS positive for opiates  COURSE/EVENTS/STUDIES 2/23 admitted for depressed LOC, drug OD 2/23 CT head: NAD 2/24 elevated troponin I (peak 3.02) 2/24 Echo: Systolic function was mildly to moderately reduced. The estimated ejection fraction was in the range of 40% to 45%. There is hypokinesis of the apical myocardium. 2/25 Cards consult: metoprolol, ASA, IV heparin. Plan cath when stabilized 2/25 Neurology consult: W/D syndrome with agitated delirium. Consider MRI/EEG if not improving after extubation 2/27 Extubated. Tolerating well. Cognition improved   LINES / TUBES: ETT 2/23 >> 2/27   CULTURES: MRSA PCR 2/23 >> NEG Blood 2/23 >> NEG HIV 2/24 >> NEG  ANTIBIOTICS: - Zosyn (11/15/12) >> 2/27 - Vancomycin (11/15/12) >> 11/17/12   SUBJECTIVE/OVERNIGHT/INTERVAL HX 11/20/2012: He is doing well 24 hours post extubation. He still deconditioned and a little bit confused but not agitated  VITAL SIGNS: Temp:  [98.1 F (36.7 C)-99.4 F (37.4 C)] 98.1 F (36.7 C) (02/28 1100) Pulse Rate:  [46-96] 74 (02/28 1100) Resp:  [16-22] 20 (02/28 1100) BP: (75-129)/(27-76) 128/71 mmHg (02/28 1100) SpO2:  [81 %-100 %] 100 % (02/28 1100) HEMODYNAMICS:   VENTILATOR SETTINGS:   INTAKE / OUTPUT: Intake/Output     02/27 0701 - 02/28 0700 02/28 0701 - 03/01 0700   I.V. (mL/kg) 1130.8 (14.7) 228 (3)   Other     NG/GT     IV Piggyback 12.5    Total Intake(mL/kg) 1143.3 (14.8) 228 (3)   Urine (mL/kg/hr) 715 (0.4) 200 (0.6)   Stool  1 (0)   Total Output 715 201   Net +428.3 +27        Stool  Occurrence 4 x      PHYSICAL EXAMINATION: General: NAD HEENT: WNL Neck: no JVD Heart: RRR s M Lungs: clear anteriorly Abdomen: Soft, NT, NABS Ext: no C/C/E Skin: Needle marks in the right antecubital area. Neuro: no focal deficits. Cognition appears intact except for mild confusion  LABS: BMET    Component Value Date/Time   NA 143 11/20/2012 0431   K 3.3* 11/20/2012 0431   CL 108 11/20/2012 0431   CO2 24 11/20/2012 0431   GLUCOSE 83 11/20/2012 0431   BUN 14 11/20/2012 0431   CREATININE 0.75 11/20/2012 0431   CALCIUM 9.1 11/20/2012 0431   GFRNONAA >90 11/20/2012 0431   GFRAA >90 11/20/2012 0431    CBC    Component Value Date/Time   WBC 6.3 11/20/2012 0431   RBC 4.01* 11/20/2012 0431   HGB 12.8* 11/20/2012 0431   HCT 36.9* 11/20/2012 0431   PLT 141* 11/20/2012 0431   MCV 92.0 11/20/2012 0431   MCH 31.9 11/20/2012 0431   MCHC 34.7 11/20/2012 0431   RDW 11.8 11/20/2012 0431   LYMPHSABS 1.7 11/15/2012 1839   MONOABS 1.3* 11/15/2012 1839   EOSABS 0.0 11/15/2012 1839   BASOSABS 0.0 11/15/2012 1839      CXR:  No new film  ASSESSMENT / PLAN:   PULMONARY A: Acute respiratory failure due to depressed LOC, resolved  P:   Ambulate and mobilize  CARDIOVASCULAR A:  1) 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 Status post Zosyn P MONITOR OFF ANTIBIOTICS    NEUROLOGIC A:   AMS due to Heroin overdose, resolved Possible anoxic brain injury. No acute findings on head CT.  -Improving P:   Cont to monitor Psychiatry consultation called [facility] bedside for potential  suicide precautions called   Transfer to telemetric floor and [facility] hospitalist service. [facility] and will sign off. Discuss with Dr. Butler Denmark    Dr. Kalman Shan, M.D., [facility].C.P Pulmonary and Critical Care Medicine Staff Physician [facility] [place] [facility] Pager: [phone], If no answer or between  15:00h - 7:00h: call [phone]  11/20/2012 11:37 AM

## 2012-11-20 NOTE — Progress Notes (Signed)
Pt found trying to get OOB to goto BR. AOx3 and follows commands. No fall. Pt returned to bed and given callbell and instructed in its use.

## 2012-11-20 NOTE — Progress Notes (Signed)
Clinical Social Work  CSW met with patient and mom at bedside. Patient reports that he and mom have not had time to discuss treatment options. CSW inquired if patient agreeable for CSW to research inpatient options since there are waiting lists at times. Patient agreeable and signed [facility] forms which were placed in chart. Patient reports that he was accepted to [facility] in October but never was admitted. CSW left a message with [facility] and [place]. CSW spoke with [facility] who reports no beds until Monday and CSW could call back on Monday to make referral. CSW will ask weekend CSW to follow up with patient if he has chosen between inpatient and outpatient treatment.   [place], [place] [phone]

## 2012-11-20 NOTE — Progress Notes (Signed)
NEURO HOSPITALIST PROGRESS NOTE   SUBJECTIVE:                                                                                                                        Mr. Olivar is extubated and stated that he is feeling much better, " just a slight headache today". He tells me that he is back to his baseline.   OBJECTIVE:                                                                                                                           Vital signs in last 24 hours: Temp:  [98.5 F (36.9 C)-100.4 F (38 C)] 98.9 F (37.2 C) (02/27 2000) Pulse Rate:  [46-101] 53 (02/28 0500) Resp:  [16-22] 16 (02/28 0600) BP: (75-128)/(27-76) 118/58 mmHg (02/28 0600) SpO2:  [81 %-100 %] 96 % (02/28 0500) FiO2 (%):  [40 %] 40 % (02/27 0832)  Intake/Output from previous day: 02/27 0701 - 02/28 0700 In: 1143.3 [I.V.:1130.8; IV Piggyback:12.5] Out: 715 [Urine:715] Intake/Output this shift:   Nutritional status: Cardiac  Past Medical History  Diagnosis Date  . Hepatitis C     Neurologic ROS negative with exception of above.   Neurologic Exam:  Mental Status: Alert, oriented, thought content appropriate.  Speech fluent without evidence of aphasia.  Able to follow 3 step commands without difficulty. Cranial Nerves: II: Discs flat bilaterally; Visual fields grossly normal, pupils equal, round, reactive to light and accommodation III,IV, VI: ptosis not present, extra-ocular motions intact bilaterally V,VII: smile symmetric, facial light touch sensation normal bilaterally VIII: hearing normal bilaterally IX,X: gag reflex present XI: bilateral shoulder shrug XII: midline tongue extension Motor: Right : Upper extremity   5/5    Left:     Upper extremity   5/5  Lower extremity   5/5     Lower extremity   5/5 Tone and bulk:normal tone throughout; no atrophy noted Sensory: Pinprick and light touch intact throughout, bilaterally Deep Tendon Reflexes: 2+  and symmetric throughout Plantars: Right: downgoing   Left: downgoing Cerebellar: normal finger-to-nose,  normal heel-to-shin test Gait: no tested. CV: pulses palpable throughout    Lab Results: No results found for this basename: cbc, bmp, coags, chol, tri, ldl,  hga1c   Lipid Panel No results found for this basename: CHOL, TRIG, HDL, CHOLHDL, VLDL, LDLCALC,  in the last 72 hours  Studies/Results: No results found.  MEDICATIONS                                                                                                                       I have reviewed the patient's current medications.  ASSESSMENT/PLAN:                                                                                                           Encephalopathy (resolved). No further neurological intervention required at this moment. Please, call neurology with questions, concerns, or new neurological developments.   Wyatt Portela, MD [facility] [phone]  11/20/2012, 7:55 AM

## 2012-11-21 DIAGNOSIS — F1120 Opioid dependence, uncomplicated: Secondary | ICD-10-CM | POA: Diagnosis present

## 2012-11-21 DIAGNOSIS — Z5189 Encounter for other specified aftercare: Secondary | ICD-10-CM

## 2012-11-21 DIAGNOSIS — B1920 Unspecified viral hepatitis C without hepatic coma: Secondary | ICD-10-CM

## 2012-11-21 LAB — BASIC METABOLIC PANEL
BUN: 15 mg/dL (ref 6–23)
CO2: 22 mEq/L (ref 19–32)
Calcium: 9.4 mg/dL (ref 8.4–10.5)
Creatinine, Ser: 0.71 mg/dL (ref 0.50–1.35)
GFR calc non Af Amer: >90 mL/min (ref >90)
Glucose, Bld: 86 mg/dL (ref 70–99)

## 2012-11-21 LAB — CBC
HCT: 40.8 % (ref 39.0–52.0)
Hemoglobin: 14.4 g/dL (ref 13.0–17.0)
MCH: 31.8 pg (ref 26.0–34.0)
MCHC: 35.3 g/dL (ref 30.0–36.0)
MCV: 90.1 fL (ref 78.0–100.0)
RBC: 4.53 MIL/uL (ref 4.22–5.81)

## 2012-11-21 LAB — MAGNESIUM: Magnesium: 2 mg/dL (ref 1.5–2.5)

## 2012-11-21 MED ORDER — TRAZODONE HCL 50 MG PO TABS
50.0000 mg | ORAL_TABLET | Freq: Every day | ORAL | Status: DC
  Administered 2012-11-21 – 2012-11-23 (×3): 50 mg via ORAL
  Filled 2012-11-21 (×4): qty 1

## 2012-11-21 MED ORDER — ENOXAPARIN SODIUM 40 MG/0.4ML ~~LOC~~ SOLN
40.0000 mg | SUBCUTANEOUS | Status: DC
  Administered 2012-11-21 – 2012-11-24 (×4): 40 mg via SUBCUTANEOUS
  Filled 2012-11-21 (×4): qty 0.4

## 2012-11-21 MED ORDER — MORPHINE SULFATE 2 MG/ML IJ SOLN
1.0000 mg | INTRAMUSCULAR | Status: DC | PRN
  Administered 2012-11-21 – 2012-11-24 (×10): 2 mg via INTRAVENOUS
  Filled 2012-11-21 (×9): qty 1

## 2012-11-21 MED ORDER — ACETAMINOPHEN 325 MG PO TABS
650.0000 mg | ORAL_TABLET | Freq: Four times a day (QID) | ORAL | Status: DC | PRN
  Administered 2012-11-21 – 2012-11-22 (×2): 650 mg via ORAL
  Filled 2012-11-21 (×2): qty 2

## 2012-11-21 NOTE — Consult Note (Signed)
Psychiatry Consult for Over Dose  Reason for Consult:  Overdose Referring Physician:  Dr. Marchelle Gearing. Austin Nielsen is an 27 y.o. male.  Assessment: Axis I: Opioid use disorder, severe Axis II: No diagnosis Axis III:  Past Medical History  Diagnosis Date  . Hepatitis C    Axis IV: economic problems, occupational problems and problems with primary support group Axis V: 31-40 impairment in reality testing  Plan:  1. Patient would benefit from referral to long term substance abuse treatment program. 2. Discussed naltrexone, however patient has hepatitis C and naltrexone can cause further hepatocellular injury. 3. Would recommend IVC if patient wishes to leave prior to being medically cleared, as he is at high risk for relapse.  Subjective:   Austin Nielsen is a 27 y.o. male patient admitted with overdose of opiates.  HPI:    The patient says he feels "okay". He reports that has overdosed several times in the past and that his longest period of sobriety was 5 months.  He reports that he is having headaches and continued arm pain despite being on a fentanyl patch.  He states he has seriously considered a longer rehabilitation as a result of his last overdose. He reports concerns about his Hepatitis C and whether he will live very long anyway.  In the area of affective symptoms, patient appears mildly depressed. Patient denies current suicidal ideation, intent, or plan. Patient denies current homicidal ideation, intent, or plan. Patient denies auditory hallucinations. Patient denies visual hallucinations. Patient denies symptoms of paranoia. Patient states sleep is good.  Appetite is improving. Energy level is good. Patient denies symptoms of anhedonia. Patient denies hopelessness, helplessness, or guilt. Denies any recent episodes consistent with mania, particularly decreased need for sleep with increased energy, grandiosity, impulsivity, hyperverbal and pressured speech, or  increased productivity.  Denies any history of trauma or symptoms consistent with PTSD such as flashbacks, nightmares, hypervigilance, feelings of numbness or inability to connect with others.   Past Psychiatric History: Past Medical History  Diagnosis Date  . Hepatitis C    History reviewed. No pertinent past surgical history.  reports that he has been smoking Cigarettes.  He has been smoking about 1.00 pack per day. He does not have any smokeless tobacco history on file. He reports that  drinks alcohol. He reports that he uses illicit drugs. History reviewed. No pertinent family history. Allergies:  No Known Allergies  Objective: Blood pressure 115/68, pulse 64, temperature 98 F (36.7 C), temperature source Oral, resp. rate 18, height 6' (1.829 m), weight 77.1 kg (169 lb 15.6 oz), SpO2 99.00%.Body mass index is 23.05 kg/(m^2). Results for orders placed during the hospital encounter of 11/15/12 (from the past 72 hour(s))  HEPARIN LEVEL (UNFRACTIONATED)     Status: None   Collection Time    11/18/12  7:21 PM      Result Value Range   Heparin Unfractionated 0.61  0.30 - 0.70 IU/mL   Comment:            IF HEPARIN RESULTS ARE BELOW     EXPECTED VALUES, AND PATIENT     DOSAGE HAS BEEN CONFIRMED,     SUGGEST FOLLOW UP TESTING     OF ANTITHROMBIN III LEVELS.  SODIUM, URINE, RANDOM     Status: None   Collection Time    11/19/12  4:20 AM      Result Value Range   Sodium, Ur 110    CREATININE, URINE, RANDOM     Status:  None   Collection Time    11/19/12  4:20 AM      Result Value Range   Creatinine, Urine 315.61    OSMOLALITY, URINE     Status: None   Collection Time    11/19/12  4:20 AM      Result Value Range   Osmolality, Ur 914  390 - 1090 mOsm/kg  MAGNESIUM     Status: None   Collection Time    11/19/12  4:40 AM      Result Value Range   Magnesium 2.0  1.5 - 2.5 mg/dL  PHOSPHORUS     Status: None   Collection Time    11/19/12  4:40 AM      Result Value Range    Phosphorus 3.8  2.3 - 4.6 mg/dL  BASIC METABOLIC PANEL     Status: Abnormal   Collection Time    11/19/12  4:40 AM      Result Value Range   Sodium 145  135 - 145 mEq/L   Potassium 3.8  3.5 - 5.1 mEq/L   Chloride 110  96 - 112 mEq/L   CO2 22  19 - 32 mEq/L   Glucose, Bld 107 (*) 70 - 99 mg/dL   BUN 17  6 - 23 mg/dL   Creatinine, Ser 1.61  0.50 - 1.35 mg/dL   Calcium 9.1  8.4 - 09.6 mg/dL   GFR calc non Af Amer >90  >90 mL/min   GFR calc Af Amer >90  >90 mL/min   Comment:            The eGFR has been calculated     using the CKD EPI equation.     This calculation has not been     validated in all clinical     situations.     eGFR's persistently     <90 mL/min signify     possible Chronic Kidney Disease.  CBC     Status: Abnormal   Collection Time    11/19/12  4:40 AM      Result Value Range   WBC 8.1  4.0 - 10.5 K/uL   RBC 3.94 (*) 4.22 - 5.81 MIL/uL   Hemoglobin 12.4 (*) 13.0 - 17.0 g/dL   HCT 04.5 (*) 40.9 - 81.1 %   MCV 92.1  78.0 - 100.0 fL   MCH 31.5  26.0 - 34.0 pg   MCHC 34.2  30.0 - 36.0 g/dL   RDW 91.4  78.2 - 95.6 %   Platelets 134 (*) 150 - 400 K/uL  HEPARIN LEVEL (UNFRACTIONATED)     Status: None   Collection Time    11/19/12  4:40 AM      Result Value Range   Heparin Unfractionated 0.43  0.30 - 0.70 IU/mL   Comment:            IF HEPARIN RESULTS ARE BELOW     EXPECTED VALUES, AND PATIENT     DOSAGE HAS BEEN CONFIRMED,     SUGGEST FOLLOW UP TESTING     OF ANTITHROMBIN III LEVELS.  PRO B NATRIURETIC PEPTIDE     Status: Abnormal   Collection Time    11/19/12  4:40 AM      Result Value Range   Pro B Natriuretic peptide (BNP) 818.5 (*) 0 - 125 pg/mL  PROCALCITONIN     Status: None   Collection Time    11/19/12  4:40 AM      Result Value  Range   Procalcitonin <0.10     Comment:            Interpretation:     PCT (Procalcitonin) <= 0.5 ng/mL:     Systemic infection (sepsis) is not likely.     Local bacterial infection is possible.     (NOTE)              ICU PCT Algorithm               Non ICU PCT Algorithm        ----------------------------     ------------------------------             PCT < 0.25 ng/mL                 PCT < 0.1 ng/mL         Stopping of antibiotics            Stopping of antibiotics           strongly encouraged.               strongly encouraged.        ----------------------------     ------------------------------           PCT level decrease by               PCT < 0.25 ng/mL           >= 80% from peak PCT           OR PCT 0.25 - 0.5 ng/mL          Stopping of antibiotics                                                 encouraged.         Stopping of antibiotics               encouraged.        ----------------------------     ------------------------------           PCT level decrease by              PCT >= 0.25 ng/mL           < 80% from peak PCT            AND PCT >= 0.5 ng/mL            Continuing antibiotics                                                  encouraged.           Continuing antibiotics                encouraged.        ----------------------------     ------------------------------         PCT level increase compared          PCT > 0.5 ng/mL             with peak PCT AND              PCT >= 0.5 ng/mL  Escalation of antibiotics                                              strongly encouraged.          Escalation of antibiotics            strongly encouraged.  CBC     Status: Abnormal   Collection Time    11/20/12  4:31 AM      Result Value Range   WBC 6.3  4.0 - 10.5 K/uL   RBC 4.01 (*) 4.22 - 5.81 MIL/uL   Hemoglobin 12.8 (*) 13.0 - 17.0 g/dL   HCT 16.1 (*) 09.6 - 04.5 %   MCV 92.0  78.0 - 100.0 fL   MCH 31.9  26.0 - 34.0 pg   MCHC 34.7  30.0 - 36.0 g/dL   RDW 40.9  81.1 - 91.4 %   Platelets 141 (*) 150 - 400 K/uL  HEPARIN LEVEL (UNFRACTIONATED)     Status: None   Collection Time    11/20/12  4:31 AM      Result Value Range   Heparin Unfractionated 0.30  0.30 - 0.70  IU/mL   Comment:            IF HEPARIN RESULTS ARE BELOW     EXPECTED VALUES, AND PATIENT     DOSAGE HAS BEEN CONFIRMED,     SUGGEST FOLLOW UP TESTING     OF ANTITHROMBIN III LEVELS.  BASIC METABOLIC PANEL     Status: Abnormal   Collection Time    11/20/12  4:31 AM      Result Value Range   Sodium 143  135 - 145 mEq/L   Potassium 3.3 (*) 3.5 - 5.1 mEq/L   Chloride 108  96 - 112 mEq/L   CO2 24  19 - 32 mEq/L   Glucose, Bld 83  70 - 99 mg/dL   BUN 14  6 - 23 mg/dL   Creatinine, Ser 7.82  0.50 - 1.35 mg/dL   Calcium 9.1  8.4 - 95.6 mg/dL   GFR calc non Af Amer >90  >90 mL/min   GFR calc Af Amer >90  >90 mL/min   Comment:            The eGFR has been calculated     using the CKD EPI equation.     This calculation has not been     validated in all clinical     situations.     eGFR's persistently     <90 mL/min signify     possible Chronic Kidney Disease.  CBC     Status: None   Collection Time    11/21/12  5:00 AM      Result Value Range   WBC 6.8  4.0 - 10.5 K/uL   RBC 4.53  4.22 - 5.81 MIL/uL   Hemoglobin 14.4  13.0 - 17.0 g/dL   HCT 21.3  08.6 - 57.8 %   MCV 90.1  78.0 - 100.0 fL   MCH 31.8  26.0 - 34.0 pg   MCHC 35.3  30.0 - 36.0 g/dL   RDW 46.9  62.9 - 52.8 %   Platelets 176  150 - 400 K/uL  BASIC METABOLIC PANEL     Status: None   Collection Time    11/21/12  5:00 AM  Result Value Range   Sodium 142  135 - 145 mEq/L   Potassium 3.6  3.5 - 5.1 mEq/L   Chloride 107  96 - 112 mEq/L   CO2 22  19 - 32 mEq/L   Glucose, Bld 86  70 - 99 mg/dL   BUN 15  6 - 23 mg/dL   Creatinine, Ser 1.61  0.50 - 1.35 mg/dL   Calcium 9.4  8.4 - 09.6 mg/dL   GFR calc non Af Amer >90  >90 mL/min   GFR calc Af Amer >90  >90 mL/min   Comment:            The eGFR has been calculated     using the CKD EPI equation.     This calculation has not been     validated in all clinical     situations.     eGFR's persistently     <90 mL/min signify     possible Chronic Kidney Disease.   PHOSPHORUS     Status: None   Collection Time    11/21/12  5:00 AM      Result Value Range   Phosphorus 4.0  2.3 - 4.6 mg/dL  MAGNESIUM     Status: None   Collection Time    11/21/12  5:00 AM      Result Value Range   Magnesium 2.0  1.5 - 2.5 mg/dL   Labs are reviewed.  Current Facility-Administered Medications  Medication Dose Route Frequency Provider Last Rate Last Dose  . acetaminophen (TYLENOL) solution 650 mg  650 mg Per Tube Q6H PRN Zigmund Gottron, MD   650 mg at 11/16/12 0109  . antiseptic oral rinse (BIOTENE) solution 15 mL  15 mL Mouth Rinse QID Kalman Shan, MD   15 mL at 11/20/12 1600  . aspirin chewable tablet 81 mg  81 mg Oral Daily Kalman Shan, MD   81 mg at 11/21/12 1042  . chlorhexidine (PERIDEX) 0.12 % solution 15 mL  15 mL Mouth Rinse BID Kalman Shan, MD   15 mL at 11/21/12 1149  . enoxaparin (LOVENOX) injection 40 mg  40 mg Subcutaneous Q24H Maretta Bees, MD   40 mg at 11/21/12 1149  . feeding supplement (RESOURCE BREEZE) liquid 1 Container  1 Container Oral TID BM Hettie Holstein, RD   1 Container at 11/21/12 1405  . fentaNYL (SUBLIMAZE) injection 12.5 mcg  12.5 mcg Intravenous Q6H PRN Kalman Shan, MD   12.5 mcg at 11/21/12 1038  . metoprolol tartrate (LOPRESSOR) tablet 12.5 mg  12.5 mg Oral BID Quintella Reichert, MD   12.5 mg at 11/21/12 1042  . traZODone (DESYREL) tablet 50 mg  50 mg Oral QHS Shanker Levora Dredge, MD        Psychiatric Specialty Exam: Physical Exam  Vitals reviewed. Constitutional: He appears well-developed and well-nourished. No distress.  Skin: He is not diaphoretic.    Review of Systems  Constitutional: Negative for fever and chills.  Cardiovascular: Negative for chest pain.  Gastrointestinal: Negative for nausea, vomiting, diarrhea and constipation.    Blood pressure 115/68, pulse 64, temperature 98 F (36.7 C), temperature source Oral, resp. rate 18, height 6' (1.829 m), weight 77.1 kg (169 lb 15.6  oz), SpO2 99.00%.Body mass index is 23.05 kg/(m^2).  General Appearance: Casual  Eye Contact::  Poor  Speech:  Clear and Coherent  Volume:  Normal  Mood:  "okay"  Affect:  Congruent and Full Range  Thought Process:  Goal  Directed, Linear and Logical  Orientation:  Full (Time, Place, and Person)  Thought Content:  WDL  Suicidal Thoughts:  No  Homicidal Thoughts:  No  Memory:  Immediate;   Good Recent;   Poor Remote;   Poor  Judgement:  Poor  Insight:  Fair  Psychomotor Activity:  Normal  Concentration:  Poor  Akathisia:  Yes  Handed:  Right  AIMS (if indicated):   not indicated  Assets:  Housing Social Support  Sleep:    Poor   Jacqulyn Cane 11/21/2012 2:47 PM

## 2012-11-21 NOTE — Progress Notes (Addendum)
PATIENT DETAILS Name: Austin Nielsen Age: 27 y.o. Sex: male Date of Birth: [birth year] Admit Date: 11/15/2012 Admitting Physician Curlene Dolphin, MD PCP:Pcp Not In System  Subjective: Sleeping-easily awakes-no major complaints  Assessment/Plan: Active Problems: Acute Respiratory Failure -2/2 drug overdose (Heroin) - intubated on 2/23-extubated 2/17 -currently stable and does not require O2  Acute Encephalopathy -from toxic encephalopathy-from heroin overdose -CT Head on 2/23-negative -resolved  NSTEMI/Takasubo cardiomyopathy  -cards following -on ASA. Metoprolol -defer decision for LHC  Vs repeat 2 D Echo to cards -Echo 2/24-EF 40-45 %, with hypokinetic Apex  Heroin Dependence -seen by psych on 2/28-per Dr Blenda Peals note-patient lacks capacity-psych to follow up on Monday -will start Trazodone as recommended by Psych    Hepatitis C -further w/u to be done in the outpatient setting  Disposition: Remain inpatient  DVT Prophylaxis: Prophylactic Lovenox  Code Status: Full code   Procedures:  None  CONSULTS:  cardiology, pulmonary/intensive care, neurology and psychiatry  PHYSICAL EXAM: Vital signs in last 24 hours: Filed Vitals:   11/20/12 1417 11/20/12 2101 11/20/12 2103 11/21/12 0601  BP: 125/69 122/76 122/76 118/75  Pulse: 60 72 72 61  Temp: 98.8 F (37.1 C)  97.8 F (36.6 C) 98.1 F (36.7 C)  TempSrc: Oral  Oral Oral  Resp: 20  18 20   Height:      Weight:      SpO2: 98%  98% 97%    Weight change:  Body mass index is 23.05 kg/(m^2).   Gen Exam: Awake and alert with clear speech.   Neck: Supple, No JVD.   Chest: B/L Clear.   CVS: S1 S2 Regular, no murmurs.  Abdomen: soft, BS +, non tender, non distended.  Extremities: no edema, lower extremities warm to touch. Neurologic: Non Focal.   Skin: No Rash.   Wounds: N/A.   Intake/Output from previous day:  Intake/Output Summary (Last 24 hours) at 11/21/12 0854 Last data filed at  11/20/12 1100  Gross per 24 hour  Intake    171 ml  Output    201 ml  Net    -30 ml     LAB RESULTS: CBC  Recent Labs Lab 11/15/12 1839  11/16/12 0415 11/18/12 0130 11/19/12 0440 11/20/12 0431 11/21/12 0500  WBC 18.3*  < > 14.5* 7.5 8.1 6.3 6.8  HGB 15.3  < > 13.9 12.5* 12.4* 12.8* 14.4  HCT 44.4  < > 41.0 37.0* 36.3* 36.9* 40.8  PLT 194  < > 143* 131* 134* 141* 176  MCV 94.7  < > 93.0 92.5 92.1 92.0 90.1  MCH 32.6  < > 31.5 31.3 31.5 31.9 31.8  MCHC 34.5  < > 33.9 33.8 34.2 34.7 35.3  RDW 11.9  < > 12.0 12.0 11.9 11.8 11.9  LYMPHSABS 1.7  --   --   --   --   --   --   MONOABS 1.3*  --   --   --   --   --   --   EOSABS 0.0  --   --   --   --   --   --   BASOSABS 0.0  --   --   --   --   --   --   < > = values in this interval not displayed.  Chemistries   Recent Labs Lab 11/15/12 1839  11/17/12 0423 11/18/12 0130 11/19/12 0440 11/20/12 0431 11/21/12 0500  NA 134*  < > 143 142 145 143 142  K 6.4*  < > 3.4* 3.3* 3.8 3.3* 3.6  CL 98  < > 107 106 110 108 107  CO2 22  < > 26 24 22 24 22   GLUCOSE 199*  < > 91 94 107* 83 86  BUN 21  < > 10 12 17 14 15   CREATININE 1.36*  < > 0.86 0.85 0.87 0.75 0.71  CALCIUM 8.5  < > 9.0 8.8 9.1 9.1 9.4  MG 1.7  --  2.0 2.0 2.0  --  2.0  < > = values in this interval not displayed.  CBG:  Recent Labs Lab 11/15/12 2229 11/17/12 1226  GLUCAP 106* 118*    GFR Estimated Creatinine Clearance: 149.9 ml/min (by C-G formula based on Cr of 0.71).  Coagulation profile No results found for this basename: INR, PROTIME,  in the last 168 hours  Cardiac Enzymes  Recent Labs Lab 11/16/12 0144 11/16/12 0730 11/16/12 1730 11/17/12 1509  CKMB  --   --   --  4.0  TROPONINI 0.94* 0.85* 3.02*  --     No components found with this basename: POCBNP,  No results found for this basename: DDIMER,  in the last 72 hours No results found for this basename: HGBA1C,  in the last 72 hours No results found for this basename: CHOL, HDL,  LDLCALC, TRIG, CHOLHDL, LDLDIRECT,  in the last 72 hours No results found for this basename: TSH, T4TOTAL, FREET3, T3FREE, THYROIDAB,  in the last 72 hours No results found for this basename: VITAMINB12, FOLATE, FERRITIN, TIBC, IRON, RETICCTPCT,  in the last 72 hours No results found for this basename: LIPASE, AMYLASE,  in the last 72 hours  Urine Studies No results found for this basename: UACOL, UAPR, USPG, UPH, UTP, UGL, UKET, UBIL, UHGB, UNIT, UROB, ULEU, UEPI, UWBC, URBC, UBAC, CAST, CRYS, UCOM, BILUA,  in the last 72 hours  MICROBIOLOGY: Recent Results (from the past 240 hour(s))  CULTURE, BLOOD (ROUTINE X 2)     Status: None   Collection Time    11/15/12  9:25 PM      Result Value Range Status   Specimen Description BLOOD RIGHT ARM   Final   Special Requests BOTTLES DRAWN AEROBIC AND ANAEROBIC 10CC EACH   Final   Culture  Setup Time 11/16/2012 02:00   Final   Culture     Final   Value:        BLOOD CULTURE RECEIVED NO GROWTH TO DATE CULTURE WILL BE HELD FOR 5 DAYS BEFORE ISSUING A FINAL NEGATIVE REPORT   Report Status PENDING   Incomplete  CULTURE, BLOOD (ROUTINE X 2)     Status: None   Collection Time    11/15/12  9:35 PM      Result Value Range Status   Specimen Description BLOOD RIGHT HAND   Final   Special Requests BOTTLES DRAWN AEROBIC ONLY 10CC   Final   Culture  Setup Time 11/16/2012 02:00   Final   Culture     Final   Value:        BLOOD CULTURE RECEIVED NO GROWTH TO DATE CULTURE WILL BE HELD FOR 5 DAYS BEFORE ISSUING A FINAL NEGATIVE REPORT   Report Status PENDING   Incomplete  MRSA PCR SCREENING     Status: None   Collection Time    11/15/12  9:37 PM      Result Value Range Status   MRSA by PCR NEGATIVE  NEGATIVE Final   Comment:  The GeneXpert MRSA Assay (FDA     approved for NASAL specimens     only), is one component of a     comprehensive MRSA colonization     surveillance program. It is not     intended to diagnose MRSA     infection nor to  guide or     monitor treatment for     MRSA infections.    RADIOLOGY STUDIES/RESULTS: Ct Head Wo Contrast  11/15/2012  *RADIOLOGY REPORT*  Clinical Data: Unresponsive.  Altered mental status.  CT HEAD WITHOUT CONTRAST  Technique:  Contiguous axial images were obtained from the base of the skull through the vertex without contrast.  Comparison: None.  Findings: The brain has a normal appearance without evidence of malformation, atrophy, old or acute infarction, mass lesion, hemorrhage, hydrocephalus or significant extra-axial collection. 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  No skull fracture.  There are some mucosal inflammatory changes of the sinuses, most notable on the right maxillary sinus.  IMPRESSION: Normal appearance of the brain.  Some sinus inflammation, particularly of the right maxillary sinus.   Original Report Authenticated By: Paulina Fusi, M.D.    Dg Chest Port 1 View  11/17/2012  *RADIOLOGY REPORT*  Clinical Data: Evaluate endotracheal tube, shortness of breath  PORTABLE CHEST - 1 VIEW  Comparison: 11/15/2012  Findings:  Grossly unchanged cardiac silhouette and mediastinal contours. Endotracheal tube overlies the tracheal air column with tip superior to the carina.  Interval placement of enteric tube which appears coiled over the gastric fundus. Minimal infrahilar heterogeneous atelectasis.  No focal airspace opacities.  No supine evidence of pneumothorax or pleural effusion.  Grossly unchanged bones.  IMPRESSION: 1.  Appropriately positioned support apparatus as above.  No pneumothorax. 2.  No acute cardiopulmonary disease.   Original Report Authenticated By: Tacey Ruiz, MD    Dg Chest [facility]  11/15/2012  *RADIOLOGY REPORT*  Clinical Data: Respiratory distress.  Status post intubation.  PORTABLE CHEST - 1 VIEW  Comparison: None  Findings: The cardiomediastinal silhouette is unremarkable. An endotracheal tube is identified with tip 4.5 cm  above the carina. The lungs are clear. There is no evidence of focal airspace disease, pulmonary edema, suspicious pulmonary nodule/mass, pleural effusion, or pneumothorax. No acute bony abnormalities are identified.  IMPRESSION: Endotracheal tube placement without evidence of active cardiopulmonary disease.   Original Report Authenticated By: Harmon Pier, M.D.     MEDICATIONS: Scheduled Meds: . antiseptic oral rinse  15 mL Mouth Rinse QID  . aspirin  81 mg Oral Daily  . chlorhexidine  15 mL Mouth Rinse BID  . feeding supplement  1 Container Oral TID BM  . metoprolol tartrate  12.5 mg Oral BID   Continuous Infusions:  PRN Meds:.acetaminophen (TYLENOL) oral liquid 160 mg/5 mL, fentaNYL  Antibiotics: Anti-infectives   Start     Dose/Rate Route Frequency Ordered Stop   11/16/12 0600  piperacillin-tazobactam (ZOSYN) IVPB 3.375 g  Status:  Discontinued     3.375 g 12.5 mL/hr over 240 Minutes Intravenous 3 times per day 11/15/12 2209 11/19/12 1044   11/15/12 2300  vancomycin (VANCOCIN) IVPB 1000 mg/200 mL premix  Status:  Discontinued     1,000 mg 200 mL/hr over 60 Minutes Intravenous Every 12 hours 11/15/12 2209 11/17/12 1234   11/15/12 2215  piperacillin-tazobactam (ZOSYN) IVPB 3.375 g     3.375 g 100 mL/hr over 30 Minutes Intravenous NOW 11/15/12 2209 11/15/12 2330  Jeoffrey Massed, MD  [facility] Pager:[phone]  If 7PM-7AM, please contact night-coverage [email or web address] Password [email or web address] 11/21/2012, 8:54 AM   LOS: 6 days

## 2012-11-21 NOTE — Progress Notes (Signed)
Pt requesting different pain medication, having headache and right shoulder/arm pain.  MD request for tylenol for headache and stronger medication for worse pain.  Changed Fentyl to Morphine IV.  Trying tylenol first.  Bonney Leitz RN

## 2012-11-22 LAB — CBC
HCT: 42.7 % (ref 39.0–52.0)
Hemoglobin: 15 g/dL (ref 13.0–17.0)
MCHC: 35.1 g/dL (ref 30.0–36.0)
MCV: 90.3 fL (ref 78.0–100.0)
RDW: 12.1 % (ref 11.5–15.5)

## 2012-11-22 LAB — CULTURE, BLOOD (ROUTINE X 2): Culture: NO GROWTH

## 2012-11-22 LAB — BASIC METABOLIC PANEL
BUN: 17 mg/dL (ref 6–23)
Creatinine, Ser: 0.74 mg/dL (ref 0.50–1.35)
GFR calc non Af Amer: >90 mL/min (ref >90)
Glucose, Bld: 94 mg/dL (ref 70–99)
Potassium: 3.9 mEq/L (ref 3.5–5.1)

## 2012-11-22 MED ORDER — BIOTENE DRY MOUTH MT LIQD
15.0000 mL | Freq: Two times a day (BID) | OROMUCOSAL | Status: DC
  Administered 2012-11-23 – 2012-11-24 (×3): 15 mL via OROMUCOSAL

## 2012-11-22 NOTE — Progress Notes (Signed)
SUBJECTIVE:  No complaints OBJECTIVE:   Vitals:   Filed Vitals:   11/21/12 0601 11/21/12 1356 11/21/12 2047 11/22/12 0606  BP: 118/75 115/68 122/67 100/59  Pulse: 61 64 63 67  Temp: 98.1 F (36.7 C) 98 F (36.7 C) 98.3 F (36.8 C) 97.7 F (36.5 C)  TempSrc: Oral Oral Oral Oral  Resp: 20 18 20 20   Height:      Weight:      SpO2: 97% 99% 97% 100%   I&O's:   Intake/Output Summary (Last 24 hours) at 11/22/12 1002 Last data filed at 11/22/12 0850  Gross per 24 hour  Intake    988 ml  Output    801 ml  Net    187 ml   TELEMETRY: Reviewed telemetry pt in NSR:     PHYSICAL EXAM General: Well developed, well nourished, in no acute distress Head: Eyes PERRLA, No xanthomas.   Normal cephalic and atramatic  Lungs:   Clear bilaterally to auscultation and percussion. Heart:   HRRR S1 S2 Pulses are 2+ & equal. Extremities:   No clubbing, cyanosis or edema.  DP +1 Neuro: Alert and oriented X 3. Psych:  Good affect, responds appropriately   LABS: Basic Metabolic Panel:  Recent Labs  16/10/96 0500 11/22/12 0630  NA 142 141  K 3.6 3.9  CL 107 106  CO2 22 26  GLUCOSE 86 94  BUN 15 17  CREATININE 0.71 0.74  CALCIUM 9.4 9.8  MG 2.0  --   PHOS 4.0  --    Liver Function Tests: No results found for this basename: AST, ALT, ALKPHOS, BILITOT, PROT, ALBUMIN,  in the last 72 hours No results found for this basename: LIPASE, AMYLASE,  in the last 72 hours CBC:  Recent Labs  11/21/12 0500 11/22/12 0630  WBC 6.8 7.7  HGB 14.4 15.0  HCT 40.8 42.7  MCV 90.1 90.3  PLT 176 206     RADIOLOGY: Ct Head Wo Contrast  11/15/2012  *RADIOLOGY REPORT*  Clinical Data: Unresponsive.  Altered mental status.  CT HEAD WITHOUT CONTRAST  Technique:  Contiguous axial images were obtained from the base of the skull through the vertex without contrast.  Comparison: None.  Findings: The brain has a normal appearance without evidence of malformation, atrophy, old or acute infarction, mass lesion,  hemorrhage, hydrocephalus or significant extra-axial collection. 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  No skull fracture.  There are some mucosal inflammatory changes of the sinuses, most notable on the right maxillary sinus.  IMPRESSION: Normal appearance of the brain.  Some sinus inflammation, particularly of the right maxillary sinus.   Original Report Authenticated By: Paulina Fusi, M.D.    Dg Chest Port 1 View  11/17/2012  *RADIOLOGY REPORT*  Clinical Data: Evaluate endotracheal tube, shortness of breath  PORTABLE CHEST - 1 VIEW  Comparison: 11/15/2012  Findings:  Grossly unchanged cardiac silhouette and mediastinal contours. Endotracheal tube overlies the tracheal air column with tip superior to the carina.  Interval placement of enteric tube which appears coiled over the gastric fundus. Minimal infrahilar heterogeneous atelectasis.  No focal airspace opacities.  No supine evidence of pneumothorax or pleural effusion.  Grossly unchanged bones.  IMPRESSION: 1.  Appropriately positioned support apparatus as above.  No pneumothorax. 2.  No acute cardiopulmonary disease.   Original Report Authenticated By: Tacey Ruiz, MD    Dg Chest [facility]  11/15/2012  *RADIOLOGY REPORT*  Clinical Data: Respiratory distress.  Status post intubation.  PORTABLE CHEST - 1 VIEW  Comparison: None  Findings: The cardiomediastinal silhouette is unremarkable. An endotracheal tube is identified with tip 4.5 cm above the carina. The lungs are clear. There is no evidence of focal airspace disease, pulmonary edema, suspicious pulmonary nodule/mass, pleural effusion, or pneumothorax. No acute bony abnormalities are identified.  IMPRESSION: Endotracheal tube placement without evidence of active cardiopulmonary disease.   Original Report Authenticated By: Harmon Pier, M.D.     ASSESSMENT:  1. Elevated troponin c/w NSTEMI most likely stress induced from Heroin overdose. He has deeply  inverted T waves in the anterior leads and mildly reduced LVF with EF 40-45 % with apical HK which may represent Takotsubo CM. Interestingly his CPK was significantly elevated with a normal MB making acute coronary syndrome unlikely.  2. Heroin overdose  3. Hepatits C  4. Acute renal failure resolved  PLAN:  1.Discussed with colleagues - given patient's age it is unlikely that he has underlying CAD.  He has never had chest pain.  Therefore I will recheck an echo to see if LVF has improved which typically does in setting of Takotsubo CM       Quintella Reichert, MD  11/22/2012  10:02 AM

## 2012-11-22 NOTE — Progress Notes (Signed)
PATIENT DETAILS Name: Austin Nielsen Age: 27 y.o. Sex: male Date of Birth: 05/25/1986 Admit Date: 11/15/2012 Admitting Physician Curlene Dolphin, MD PCP:Pcp Not In System  Subjective: no major complaints  Assessment/Plan: Active Problems: Acute Respiratory Failure -2/2 drug overdose (Heroin) - intubated on 2/23-extubated 2/17 -currently stable and does not require O2  Acute Encephalopathy -from toxic encephalopathy-from heroin overdose -CT Head on 2/23-negative -resolved  NSTEMI/Takasubo cardiomyopathy  -cards following -on ASA. Metoprolol -Echo 2/24-EF 40-45 %, with hypokinetic Apex - Seen by cardiology today, the plan is to repeat an echo-no need to pursue left heart catheterization given the patient's young age.  Heroin Dependence -seen by psych on 2/28-per Dr Blenda Peals note-patient lacks capacity-psych to follow up on Monday -onTrazodone as recommended by Psych    Hepatitis C -further w/u to be done in the outpatient setting  Disposition: Remain inpatient  DVT Prophylaxis: Prophylactic Lovenox  Code Status: Full code   Procedures:  None  CONSULTS:  cardiology, pulmonary/intensive care, neurology and psychiatry  PHYSICAL EXAM: Vital signs in last 24 hours: Filed Vitals:   11/21/12 0601 11/21/12 1356 11/21/12 2047 11/22/12 0606  BP: 118/75 115/68 122/67 100/59  Pulse: 61 64 63 67  Temp: 98.1 F (36.7 C) 98 F (36.7 C) 98.3 F (36.8 C) 97.7 F (36.5 C)  TempSrc: Oral Oral Oral Oral  Resp: 20 18 20 20   Height:      Weight:      SpO2: 97% 99% 97% 100%    Weight change:  Body mass index is 23.05 kg/(m^2).   Gen Exam: Awake and alert with clear speech.   Neck: Supple, No JVD.   Chest: B/L Clear.  No rales or rhonchi CVS: S1 S2 Regular, no murmurs.  Abdomen: soft, BS +, non tender, non distended.  Extremities: no edema, lower extremities warm to touch. Neurologic: Non Focal.   Skin: No Rash.   Wounds: N/A.   Intake/Output from  previous day:  Intake/Output Summary (Last 24 hours) at 11/22/12 1020 Last data filed at 11/22/12 0850  Gross per 24 hour  Intake    988 ml  Output    801 ml  Net    187 ml     LAB RESULTS: CBC  Recent Labs Lab 11/15/12 1839  11/18/12 0130 11/19/12 0440 11/20/12 0431 11/21/12 0500 11/22/12 0630  WBC 18.3*  < > 7.5 8.1 6.3 6.8 7.7  HGB 15.3  < > 12.5* 12.4* 12.8* 14.4 15.0  HCT 44.4  < > 37.0* 36.3* 36.9* 40.8 42.7  PLT 194  < > 131* 134* 141* 176 206  MCV 94.7  < > 92.5 92.1 92.0 90.1 90.3  MCH 32.6  < > 31.3 31.5 31.9 31.8 31.7  MCHC 34.5  < > 33.8 34.2 34.7 35.3 35.1  RDW 11.9  < > 12.0 11.9 11.8 11.9 12.1  LYMPHSABS 1.7  --   --   --   --   --   --   MONOABS 1.3*  --   --   --   --   --   --   EOSABS 0.0  --   --   --   --   --   --   BASOSABS 0.0  --   --   --   --   --   --   < > = values in this interval not displayed.  Chemistries   Recent Labs Lab 11/15/12 1839  11/17/12 0423 11/18/12 0130 11/19/12 0440 11/20/12 0431  11/21/12 0500 11/22/12 0630  NA 134*  < > 143 142 145 143 142 141  K 6.4*  < > 3.4* 3.3* 3.8 3.3* 3.6 3.9  CL 98  < > 107 106 110 108 107 106  CO2 22  < > 26 24 22 24 22 26   GLUCOSE 199*  < > 91 94 107* 83 86 94  BUN 21  < > 10 12 17 14 15 17   CREATININE 1.36*  < > 0.86 0.85 0.87 0.75 0.71 0.74  CALCIUM 8.5  < > 9.0 8.8 9.1 9.1 9.4 9.8  MG 1.7  --  2.0 2.0 2.0  --  2.0  --   < > = values in this interval not displayed.  CBG:  Recent Labs Lab 11/15/12 2229 11/17/12 1226  GLUCAP 106* 118*    GFR Estimated Creatinine Clearance: 149.9 ml/min (by C-G formula based on Cr of 0.74).  Coagulation profile No results found for this basename: INR, PROTIME,  in the last 168 hours  Cardiac Enzymes  Recent Labs Lab 11/16/12 0144 11/16/12 0730 11/16/12 1730 11/17/12 1509  CKMB  --   --   --  4.0  TROPONINI 0.94* 0.85* 3.02*  --     No components found with this basename: POCBNP,  No results found for this basename: DDIMER,  in  the last 72 hours No results found for this basename: HGBA1C,  in the last 72 hours No results found for this basename: CHOL, HDL, LDLCALC, TRIG, CHOLHDL, LDLDIRECT,  in the last 72 hours No results found for this basename: TSH, T4TOTAL, FREET3, T3FREE, THYROIDAB,  in the last 72 hours No results found for this basename: VITAMINB12, FOLATE, FERRITIN, TIBC, IRON, RETICCTPCT,  in the last 72 hours No results found for this basename: LIPASE, AMYLASE,  in the last 72 hours  Urine Studies No results found for this basename: UACOL, UAPR, USPG, UPH, UTP, UGL, UKET, UBIL, UHGB, UNIT, UROB, ULEU, UEPI, UWBC, URBC, UBAC, CAST, CRYS, UCOM, BILUA,  in the last 72 hours  MICROBIOLOGY: Recent Results (from the past 240 hour(s))  CULTURE, BLOOD (ROUTINE X 2)     Status: None   Collection Time    11/15/12  9:25 PM      Result Value Range Status   Specimen Description BLOOD RIGHT ARM   Final   Special Requests BOTTLES DRAWN AEROBIC AND ANAEROBIC 10CC EACH   Final   Culture  Setup Time 11/16/2012 02:00   Final   Culture     Final   Value:        BLOOD CULTURE RECEIVED NO GROWTH TO DATE CULTURE WILL BE HELD FOR 5 DAYS BEFORE ISSUING A FINAL NEGATIVE REPORT   Report Status PENDING   Incomplete  CULTURE, BLOOD (ROUTINE X 2)     Status: None   Collection Time    11/15/12  9:35 PM      Result Value Range Status   Specimen Description BLOOD RIGHT HAND   Final   Special Requests BOTTLES DRAWN AEROBIC ONLY 10CC   Final   Culture  Setup Time 11/16/2012 02:00   Final   Culture     Final   Value:        BLOOD CULTURE RECEIVED NO GROWTH TO DATE CULTURE WILL BE HELD FOR 5 DAYS BEFORE ISSUING A FINAL NEGATIVE REPORT   Report Status PENDING   Incomplete  MRSA PCR SCREENING     Status: None   Collection Time    11/15/12  9:37 PM      Result Value Range Status   MRSA by PCR NEGATIVE  NEGATIVE Final   Comment:            The GeneXpert MRSA Assay (FDA     approved for NASAL specimens     only), is one component  of a     comprehensive MRSA colonization     surveillance program. It is not     intended to diagnose MRSA     infection nor to guide or     monitor treatment for     MRSA infections.    RADIOLOGY STUDIES/RESULTS: Ct Head Wo Contrast  11/15/2012  *RADIOLOGY REPORT*  Clinical Data: Unresponsive.  Altered mental status.  CT HEAD WITHOUT CONTRAST  Technique:  Contiguous axial images were obtained from the base of the skull through the vertex without contrast.  Comparison: None.  Findings: The brain has a normal appearance without evidence of malformation, atrophy, old or acute infarction, mass lesion, hemorrhage, hydrocephalus or significant extra-axial collection. 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  No skull fracture.  There are some mucosal inflammatory changes of the sinuses, most notable on the right maxillary sinus.  IMPRESSION: Normal appearance of the brain.  Some sinus inflammation, particularly of the right maxillary sinus.   Original Report Authenticated By: Paulina Fusi, M.D.    Dg Chest Port 1 View  11/17/2012  *RADIOLOGY REPORT*  Clinical Data: Evaluate endotracheal tube, shortness of breath  PORTABLE CHEST - 1 VIEW  Comparison: 11/15/2012  Findings:  Grossly unchanged cardiac silhouette and mediastinal contours. Endotracheal tube overlies the tracheal air column with tip superior to the carina.  Interval placement of enteric tube which appears coiled over the gastric fundus. Minimal infrahilar heterogeneous atelectasis.  No focal airspace opacities.  No supine evidence of pneumothorax or pleural effusion.  Grossly unchanged bones.  IMPRESSION: 1.  Appropriately positioned support apparatus as above.  No pneumothorax. 2.  No acute cardiopulmonary disease.   Original Report Authenticated By: Tacey Ruiz, MD    Dg Chest [facility]  11/15/2012  *RADIOLOGY REPORT*  Clinical Data: Respiratory distress.  Status post intubation.  PORTABLE CHEST - 1  VIEW  Comparison: None  Findings: The cardiomediastinal silhouette is unremarkable. An endotracheal tube is identified with tip 4.5 cm above the carina. The lungs are clear. There is no evidence of focal airspace disease, pulmonary edema, suspicious pulmonary nodule/mass, pleural effusion, or pneumothorax. No acute bony abnormalities are identified.  IMPRESSION: Endotracheal tube placement without evidence of active cardiopulmonary disease.   Original Report Authenticated By: Harmon Pier, M.D.     MEDICATIONS: Scheduled Meds: . antiseptic oral rinse  15 mL Mouth Rinse QID  . aspirin  81 mg Oral Daily  . chlorhexidine  15 mL Mouth Rinse BID  . enoxaparin (LOVENOX) injection  40 mg Subcutaneous Q24H  . feeding supplement  1 Container Oral TID BM  . metoprolol tartrate  12.5 mg Oral BID  . traZODone  50 mg Oral QHS   Continuous Infusions:  PRN Meds:.acetaminophen, morphine injection  Antibiotics: Anti-infectives   Start     Dose/Rate Route Frequency Ordered Stop   11/16/12 0600  piperacillin-tazobactam (ZOSYN) IVPB 3.375 g  Status:  Discontinued     3.375 g 12.5 mL/hr over 240 Minutes Intravenous 3 times per day 11/15/12 2209 11/19/12 1044   11/15/12 2300  vancomycin (VANCOCIN) IVPB 1000 mg/200 mL premix  Status:  Discontinued  1,000 mg 200 mL/hr over 60 Minutes Intravenous Every 12 hours 11/15/12 2209 11/17/12 1234   11/15/12 2215  piperacillin-tazobactam (ZOSYN) IVPB 3.375 g     3.375 g 100 mL/hr over 30 Minutes Intravenous NOW 11/15/12 2209 11/15/12 2330       Jeoffrey Massed, MD  [facility] Pager:[phone]  If 7PM-7AM, please contact night-coverage [email or web address] Password [email or web address] 11/22/2012, 10:20 AM   LOS: 7 days

## 2012-11-22 NOTE — Progress Notes (Signed)
  Echocardiogram 2D Echocardiogram has been performed.  Austin Nielsen 11/22/2012, 5:59 PM

## 2012-11-22 NOTE — Consult Note (Addendum)
Psychiatry Consult for Over Dose  Reason for Consult:  Overdose Referring Physician:  Dr. Marchelle Gearing. Austin Nielsen is an 27 y.o. male.  Assessment: Axis I: Opioid use disorder, severe Axis II: No diagnosis Axis III:  Past Medical History  Diagnosis Date  . Hepatitis C    Axis IV: economic problems, occupational problems and problems with primary support group Axis V: 31-40 impairment in reality testing  Plan:  1. Patient would benefit from referral to long term substance abuse treatment program. 2. Discussed naltrexone, however patient has hepatitis C and naltrexone can cause further hepatocellular injury. 3. Would recommend IVC if patient wishes to leave prior to being medically cleared, as he is at high risk for relapse. 4. Psychiatry Will Follow. Subjective:   Austin Nielsen is a 27 y.o. male patient admitted with overdose of opiates.  HPI:    The patient has some discontent about his pain medications, though he has some limited insight to why his medication options are limited.  He continues to be interested in rehabilitation. His father is in the room, and relates some depression which may have begun at the age of 37 after the patient's sister died of overdose at the age of 41. He endorses his addiction began with pain medication prescribed for a shoulder injury.  In the area of affective symptoms, patient appears mildly depressed. Patient denies current suicidal ideation, intent, or plan. Patient denies current homicidal ideation, intent, or plan. Patient denies auditory hallucinations. Patient denies visual hallucinations. Patient denies symptoms of paranoia. Patient states sleep is poor.  Appetite is fair. Energy level is good. Patient denies symptoms of anhedonia. Patient denies hopelessness, helplessness, but endorses guilt. Denies any recent episodes consistent with mania, particularly decreased need for sleep with increased energy, grandiosity, impulsivity,  hyperverbal and pressured speech, or increased productivity.  Denies any history of trauma or symptoms consistent with PTSD such as flashbacks, nightmares, hypervigilance, feelings of numbness or inability to connect with others.   Past Psychiatric History: Past Medical History  Diagnosis Date  . Hepatitis C    History reviewed. No pertinent past surgical history.  reports that he has been smoking Cigarettes.  He has been smoking about 1.00 pack per day. He does not have any smokeless tobacco history on file. He reports that  drinks alcohol. He reports that he uses illicit drugs. History reviewed. No pertinent family history. Allergies:  No Known Allergies  Objective: Blood pressure 125/74, pulse 68, temperature 98.5 F (36.9 C), temperature source Oral, resp. rate 18, height 6' (1.829 m), weight 77.1 kg (169 lb 15.6 oz), SpO2 96.00%.Body mass index is 23.05 kg/(m^2). Results for orders placed during the hospital encounter of 11/15/12 (from the past 72 hour(s))  CBC     Status: Abnormal   Collection Time    11/20/12  4:31 AM      Result Value Range   WBC 6.3  4.0 - 10.5 K/uL   RBC 4.01 (*) 4.22 - 5.81 MIL/uL   Hemoglobin 12.8 (*) 13.0 - 17.0 g/dL   HCT 16.1 (*) 09.6 - 04.5 %   MCV 92.0  78.0 - 100.0 fL   MCH 31.9  26.0 - 34.0 pg   MCHC 34.7  30.0 - 36.0 g/dL   RDW 40.9  81.1 - 91.4 %   Platelets 141 (*) 150 - 400 K/uL  HEPARIN LEVEL (UNFRACTIONATED)     Status: None   Collection Time    11/20/12  4:31 AM  Result Value Range   Heparin Unfractionated 0.30  0.30 - 0.70 IU/mL   Comment:            IF HEPARIN RESULTS ARE BELOW     EXPECTED VALUES, AND PATIENT     DOSAGE HAS BEEN CONFIRMED,     SUGGEST FOLLOW UP TESTING     OF ANTITHROMBIN III LEVELS.  BASIC METABOLIC PANEL     Status: Abnormal   Collection Time    11/20/12  4:31 AM      Result Value Range   Sodium 143  135 - 145 mEq/L   Potassium 3.3 (*) 3.5 - 5.1 mEq/L   Chloride 108  96 - 112 mEq/L   CO2 24  19 - 32  mEq/L   Glucose, Bld 83  70 - 99 mg/dL   BUN 14  6 - 23 mg/dL   Creatinine, Ser 6.21  0.50 - 1.35 mg/dL   Calcium 9.1  8.4 - 30.8 mg/dL   GFR calc non Af Amer >90  >90 mL/min   GFR calc Af Amer >90  >90 mL/min   Comment:            The eGFR has been calculated     using the CKD EPI equation.     This calculation has not been     validated in all clinical     situations.     eGFR's persistently     <90 mL/min signify     possible Chronic Kidney Disease.  CBC     Status: None   Collection Time    11/21/12  5:00 AM      Result Value Range   WBC 6.8  4.0 - 10.5 K/uL   RBC 4.53  4.22 - 5.81 MIL/uL   Hemoglobin 14.4  13.0 - 17.0 g/dL   HCT 65.7  84.6 - 96.2 %   MCV 90.1  78.0 - 100.0 fL   MCH 31.8  26.0 - 34.0 pg   MCHC 35.3  30.0 - 36.0 g/dL   RDW 95.2  84.1 - 32.4 %   Platelets 176  150 - 400 K/uL  BASIC METABOLIC PANEL     Status: None   Collection Time    11/21/12  5:00 AM      Result Value Range   Sodium 142  135 - 145 mEq/L   Potassium 3.6  3.5 - 5.1 mEq/L   Chloride 107  96 - 112 mEq/L   CO2 22  19 - 32 mEq/L   Glucose, Bld 86  70 - 99 mg/dL   BUN 15  6 - 23 mg/dL   Creatinine, Ser 4.01  0.50 - 1.35 mg/dL   Calcium 9.4  8.4 - 02.7 mg/dL   GFR calc non Af Amer >90  >90 mL/min   GFR calc Af Amer >90  >90 mL/min   Comment:            The eGFR has been calculated     using the CKD EPI equation.     This calculation has not been     validated in all clinical     situations.     eGFR's persistently     <90 mL/min signify     possible Chronic Kidney Disease.  PHOSPHORUS     Status: None   Collection Time    11/21/12  5:00 AM      Result Value Range   Phosphorus 4.0  2.3 - 4.6 mg/dL  MAGNESIUM     Status: None   Collection Time    11/21/12  5:00 AM      Result Value Range   Magnesium 2.0  1.5 - 2.5 mg/dL  CBC     Status: None   Collection Time    11/22/12  6:30 AM      Result Value Range   WBC 7.7  4.0 - 10.5 K/uL   RBC 4.73  4.22 - 5.81 MIL/uL    Hemoglobin 15.0  13.0 - 17.0 g/dL   HCT 96.0  45.4 - 09.8 %   MCV 90.3  78.0 - 100.0 fL   MCH 31.7  26.0 - 34.0 pg   MCHC 35.1  30.0 - 36.0 g/dL   RDW 11.9  14.7 - 82.9 %   Platelets 206  150 - 400 K/uL  BASIC METABOLIC PANEL     Status: None   Collection Time    11/22/12  6:30 AM      Result Value Range   Sodium 141  135 - 145 mEq/L   Potassium 3.9  3.5 - 5.1 mEq/L   Chloride 106  96 - 112 mEq/L   CO2 26  19 - 32 mEq/L   Glucose, Bld 94  70 - 99 mg/dL   BUN 17  6 - 23 mg/dL   Creatinine, Ser 5.62  0.50 - 1.35 mg/dL   Calcium 9.8  8.4 - 13.0 mg/dL   GFR calc non Af Amer >90  >90 mL/min   GFR calc Af Amer >90  >90 mL/min   Comment:            The eGFR has been calculated     using the CKD EPI equation.     This calculation has not been     validated in all clinical     situations.     eGFR's persistently     <90 mL/min signify     possible Chronic Kidney Disease.   Labs are reviewed.  Current Facility-Administered Medications  Medication Dose Route Frequency Kaled Allende Last Rate Last Dose  . acetaminophen (TYLENOL) tablet 650 mg  650 mg Oral Q6H PRN Maretta Bees, MD   650 mg at 11/22/12 0836  . [START ON 11/23/2012] antiseptic oral rinse (BIOTENE) solution 15 mL  15 mL Mouth Rinse BID Maretta Bees, MD      . aspirin chewable tablet 81 mg  81 mg Oral Daily Kalman Shan, MD   81 mg at 11/22/12 1116  . enoxaparin (LOVENOX) injection 40 mg  40 mg Subcutaneous Q24H Maretta Bees, MD   40 mg at 11/22/12 1116  . feeding supplement (RESOURCE BREEZE) liquid 1 Container  1 Container Oral TID BM Hettie Holstein, RD   1 Container at 11/21/12 2000  . metoprolol tartrate (LOPRESSOR) tablet 12.5 mg  12.5 mg Oral BID Quintella Reichert, MD   12.5 mg at 11/22/12 1116  . morphine 2 MG/ML injection 1-2 mg  1-2 mg Intravenous Q4H PRN Maretta Bees, MD   2 mg at 11/22/12 2046  . traZODone (DESYREL) tablet 50 mg  50 mg Oral QHS Maretta Bees, MD   50 mg at 11/21/12 2149     Psychiatric Specialty Exam: Physical Exam  Vitals reviewed. Constitutional: He appears well-developed and well-nourished. No distress.  Skin: He is not diaphoretic.    Review of Systems  Constitutional: Negative for fever and chills.  Cardiovascular: Negative for chest pain.  Gastrointestinal: Negative for  nausea, vomiting, diarrhea and constipation.    Blood pressure 125/74, pulse 68, temperature 98.5 F (36.9 C), temperature source Oral, resp. rate 18, height 6' (1.829 m), weight 77.1 kg (169 lb 15.6 oz), SpO2 96.00%.Body mass index is 23.05 kg/(m^2).  General Appearance: Casual  Eye Contact::  Poor  Speech:  Clear and Coherent  Volume:  Normal  Mood:  "shaky"  Affect:  Congruent and Full Range  Thought Process:  Goal Directed, Linear and Logical  Orientation:  Full (Time, Place, and Person)  Thought Content:  WDL  Suicidal Thoughts:  No  Homicidal Thoughts:  No  Memory:  Immediate;   Good Recent;   Good Remote;   Fair  Judgement:  Poor  Insight:  Fair  Psychomotor Activity:  Normal  Concentration:  Poor  Akathisia:  Yes  Handed:  Right  AIMS (if indicated):   not indicated  Assets:  Housing Social Support  Sleep:    Poor   PUTHUVEL, SHAJI 11/22/2012 4:18 PM

## 2012-11-23 LAB — CBC
HCT: 45.1 % (ref 39.0–52.0)
Hemoglobin: 15.1 g/dL (ref 13.0–17.0)
MCH: 30.6 pg (ref 26.0–34.0)
MCHC: 33.5 g/dL (ref 30.0–36.0)
RDW: 12.2 % (ref 11.5–15.5)

## 2012-11-23 LAB — BASIC METABOLIC PANEL
BUN: 18 mg/dL (ref 6–23)
Calcium: 9.4 mg/dL (ref 8.4–10.5)
GFR calc Af Amer: >90 mL/min (ref >90)
GFR calc non Af Amer: >90 mL/min (ref >90)
Glucose, Bld: 91 mg/dL (ref 70–99)
Sodium: 141 mEq/L (ref 135–145)

## 2012-11-23 MED ORDER — ZOLPIDEM TARTRATE 5 MG PO TABS
5.0000 mg | ORAL_TABLET | Freq: Every evening | ORAL | Status: DC | PRN
  Administered 2012-11-23: 5 mg via ORAL
  Filled 2012-11-23: qty 1

## 2012-11-23 MED ORDER — IBUPROFEN 600 MG PO TABS
600.0000 mg | ORAL_TABLET | Freq: Four times a day (QID) | ORAL | Status: DC | PRN
  Administered 2012-11-23: 600 mg via ORAL
  Filled 2012-11-23: qty 1

## 2012-11-23 NOTE — Progress Notes (Signed)
Pt had a brief episode of extreme tachycardia HR 140's on telemetry x2 episodes. HR came back down to 60's both times shortly after episodes. Pt c/o  inability to fall back to sleep and has had trazadone 50mg  PO at 2135 and morphing 2mg  IV x2 last dose at 0149. Healthcare provider R. Reidler notified no new orders. Pt's VS now Blood pressure 114/69, pulse 69, temperature 97.5 F (36.4 C), temperature source Oral, resp. rate 18, SpO2 97% RA. Pt currently in bed and drifting back to sleep, will continue to monitor and assist as needed. Julien Nordmann [facility]

## 2012-11-23 NOTE — Progress Notes (Signed)
PATIENT DETAILS Name: Austin Nielsen Age: 27 y.o. Sex: male Date of Birth: [birth year] Admit Date: 11/15/2012 Admitting Physician Curlene Dolphin, MD PCP:Pcp Not In System  Subjective: Some pain in the left arm area-but otherwise no major complaints  Assessment/Plan: Active Problems: Acute Respiratory Failure -2/2 drug overdose (Heroin) - intubated on 2/23-extubated 2/17 -currently stable and does not require O2  Acute Encephalopathy -from toxic encephalopathy-from heroin overdose -CT Head on 2/23-negative -resolved  NSTEMI/Takasubo cardiomyopathy  -cards following -on ASA. Metoprolol -Echo 2/24-EF 40-45 %, with hypokinetic Apex - repeat echo on 3/2-apical hypokinesis has resolved -cards now recommending a nulear  Heroin Dependence -seen by psych on 2/28-per Dr Blenda Peals note-patient lacks capacity-psych to follow up on Monday -onTrazodone as recommended by Psych    Hepatitis C -further w/u to be done in the outpatient setting  Disposition: Remain inpatient-awaiting psych follow up to determine disposition  DVT Prophylaxis: Prophylactic Lovenox  Code Status: Full code   Procedures:  None  CONSULTS:  cardiology, pulmonary/intensive care, neurology and psychiatry  PHYSICAL EXAM: Vital signs in last 24 hours: Filed Vitals:   11/22/12 2126 11/23/12 0258 11/23/12 0442 11/23/12 1000  BP: 114/72 114/69 112/70 113/68  Pulse: 65 69 70 66  Temp: 98.7 F (37.1 C) 97.5 F (36.4 C) 97.8 F (36.6 C)   TempSrc: Oral Oral Oral   Resp: 16 18 20    Height:      Weight:      SpO2: 97% 97% 95%     Weight change:  Body mass index is 23.05 kg/(m^2).   Gen Exam: Awake and alert with clear speech.   Neck: Supple, No JVD.   Chest: B/L Clear.  No rales or rhonchi CVS: S1 S2 Regular, no murmurs.  Abdomen: soft, BS +, non tender, non distended.  Extremities: no edema, lower extremities warm to touch. Neurologic: Non Focal.   Skin: No Rash.   Wounds: N/A.    Intake/Output from previous day:  Intake/Output Summary (Last 24 hours) at 11/23/12 1037 Last data filed at 11/23/12 0338  Gross per 24 hour  Intake    600 ml  Output    302 ml  Net    298 ml     LAB RESULTS: CBC  Recent Labs Lab 11/19/12 0440 11/20/12 0431 11/21/12 0500 11/22/12 0630 11/23/12 0455  WBC 8.1 6.3 6.8 7.7 9.3  HGB 12.4* 12.8* 14.4 15.0 15.1  HCT 36.3* 36.9* 40.8 42.7 45.1  PLT 134* 141* 176 206 233  MCV 92.1 92.0 90.1 90.3 91.5  MCH 31.5 31.9 31.8 31.7 30.6  MCHC 34.2 34.7 35.3 35.1 33.5  RDW 11.9 11.8 11.9 12.1 12.2    Chemistries   Recent Labs Lab 11/17/12 0423 11/18/12 0130 11/19/12 0440 11/20/12 0431 11/21/12 0500 11/22/12 0630 11/23/12 0455  NA 143 142 145 143 142 141 141  K 3.4* 3.3* 3.8 3.3* 3.6 3.9 4.0  CL 107 106 110 108 107 106 105  CO2 26 24 22 24 22 26 27   GLUCOSE 91 94 107* 83 86 94 91  BUN 10 12 17 14 15 17 18   CREATININE 0.86 0.85 0.87 0.75 0.71 0.74 0.81  CALCIUM 9.0 8.8 9.1 9.1 9.4 9.8 9.4  MG 2.0 2.0 2.0  --  2.0  --   --     CBG:  Recent Labs Lab 11/17/12 1226  GLUCAP 118*    GFR Estimated Creatinine Clearance: 148.1 ml/min (by C-G formula based on Cr of 0.81).  Coagulation profile No results found  for this basename: INR, PROTIME,  in the last 168 hours  Cardiac Enzymes  Recent Labs Lab 11/16/12 1730 11/17/12 1509  CKMB  --  4.0  TROPONINI 3.02*  --     No components found with this basename: POCBNP,  No results found for this basename: DDIMER,  in the last 72 hours No results found for this basename: HGBA1C,  in the last 72 hours No results found for this basename: CHOL, HDL, LDLCALC, TRIG, CHOLHDL, LDLDIRECT,  in the last 72 hours No results found for this basename: TSH, T4TOTAL, FREET3, T3FREE, THYROIDAB,  in the last 72 hours No results found for this basename: VITAMINB12, FOLATE, FERRITIN, TIBC, IRON, RETICCTPCT,  in the last 72 hours No results found for this basename: LIPASE, AMYLASE,  in  the last 72 hours  Urine Studies No results found for this basename: UACOL, UAPR, USPG, UPH, UTP, UGL, UKET, UBIL, UHGB, UNIT, UROB, ULEU, UEPI, UWBC, URBC, UBAC, CAST, CRYS, UCOM, BILUA,  in the last 72 hours  MICROBIOLOGY: Recent Results (from the past 240 hour(s))  CULTURE, BLOOD (ROUTINE X 2)     Status: None   Collection Time    11/15/12  9:25 PM      Result Value Range Status   Specimen Description BLOOD RIGHT ARM   Final   Special Requests BOTTLES DRAWN AEROBIC AND ANAEROBIC 10CC EACH   Final   Culture  Setup Time 11/16/2012 02:00   Final   Culture NO GROWTH 5 DAYS   Final   Report Status 11/22/2012 FINAL   Final  CULTURE, BLOOD (ROUTINE X 2)     Status: None   Collection Time    11/15/12  9:35 PM      Result Value Range Status   Specimen Description BLOOD RIGHT HAND   Final   Special Requests BOTTLES DRAWN AEROBIC ONLY 10CC   Final   Culture  Setup Time 11/16/2012 02:00   Final   Culture NO GROWTH 5 DAYS   Final   Report Status 11/22/2012 FINAL   Final  MRSA PCR SCREENING     Status: None   Collection Time    11/15/12  9:37 PM      Result Value Range Status   MRSA by PCR NEGATIVE  NEGATIVE Final   Comment:            The GeneXpert MRSA Assay (FDA     approved for NASAL specimens     only), is one component of a     comprehensive MRSA colonization     surveillance program. It is not     intended to diagnose MRSA     infection nor to guide or     monitor treatment for     MRSA infections.    RADIOLOGY STUDIES/RESULTS: Ct Head Wo Contrast  11/15/2012  *RADIOLOGY REPORT*  Clinical Data: Unresponsive.  Altered mental status.  CT HEAD WITHOUT CONTRAST  Technique:  Contiguous axial images were obtained from the base of the skull through the vertex without contrast.  Comparison: None.  Findings: The brain has a normal appearance without evidence of malformation, atrophy, old or acute infarction, mass lesion, hemorrhage, hydrocephalus or significant extra-axial collection.  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  No skull fracture.  There are some mucosal inflammatory changes of the sinuses, most notable on the right maxillary sinus.  IMPRESSION: Normal appearance of the brain.  Some sinus inflammation, particularly of the right maxillary  sinus.   Original Report Authenticated By: Paulina Fusi, M.D.    Dg Chest Port 1 View  11/17/2012  *RADIOLOGY REPORT*  Clinical Data: Evaluate endotracheal tube, shortness of breath  PORTABLE CHEST - 1 VIEW  Comparison: 11/15/2012  Findings:  Grossly unchanged cardiac silhouette and mediastinal contours. Endotracheal tube overlies the tracheal air column with tip superior to the carina.  Interval placement of enteric tube which appears coiled over the gastric fundus. Minimal infrahilar heterogeneous atelectasis.  No focal airspace opacities.  No supine evidence of pneumothorax or pleural effusion.  Grossly unchanged bones.  IMPRESSION: 1.  Appropriately positioned support apparatus as above.  No pneumothorax. 2.  No acute cardiopulmonary disease.   Original Report Authenticated By: Tacey Ruiz, MD    Dg Chest [facility]  11/15/2012  *RADIOLOGY REPORT*  Clinical Data: Respiratory distress.  Status post intubation.  PORTABLE CHEST - 1 VIEW  Comparison: None  Findings: The cardiomediastinal silhouette is unremarkable. An endotracheal tube is identified with tip 4.5 cm above the carina. The lungs are clear. There is no evidence of focal airspace disease, pulmonary edema, suspicious pulmonary nodule/mass, pleural effusion, or pneumothorax. No acute bony abnormalities are identified.  IMPRESSION: Endotracheal tube placement without evidence of active cardiopulmonary disease.   Original Report Authenticated By: Harmon Pier, M.D.     MEDICATIONS: Scheduled Meds: . antiseptic oral rinse  15 mL Mouth Rinse BID  . aspirin  81 mg Oral Daily  . enoxaparin (LOVENOX) injection  40 mg Subcutaneous Q24H  . feeding  supplement  1 Container Oral TID BM  . metoprolol tartrate  12.5 mg Oral BID  . traZODone  50 mg Oral QHS   Continuous Infusions:  PRN Meds:.acetaminophen, morphine injection  Antibiotics: Anti-infectives   Start     Dose/Rate Route Frequency Ordered Stop   11/16/12 0600  piperacillin-tazobactam (ZOSYN) IVPB 3.375 g  Status:  Discontinued     3.375 g 12.5 mL/hr over 240 Minutes Intravenous 3 times per day 11/15/12 2209 11/19/12 1044   11/15/12 2300  vancomycin (VANCOCIN) IVPB 1000 mg/200 mL premix  Status:  Discontinued     1,000 mg 200 mL/hr over 60 Minutes Intravenous Every 12 hours 11/15/12 2209 11/17/12 1234   11/15/12 2215  piperacillin-tazobactam (ZOSYN) IVPB 3.375 g     3.375 g 100 mL/hr over 30 Minutes Intravenous NOW 11/15/12 2209 11/15/12 2330       Jeoffrey Massed, MD  [facility] Pager:[phone]  If 7PM-7AM, please contact night-coverage [email or web address] Password [facility] 11/23/2012, 10:37 AM   LOS: 8 days

## 2012-11-23 NOTE — Progress Notes (Signed)
Met with patient and his mother at bedside to follow up- patient was pleasant and appropriate- spoke very openly and genuinely about his mistakes and his fears- he is eager to get home and is willing to do outpatient CDIOP until he is able to get in to Inpatient at [facility]- waiting list confirmed there for March 13th as of today- they do not have an outpatient program so I have linked him with [facility] for CDIOP- orientation on Thursday, March 6th at 10am.  Both patient and mom are agreeable to this plan-  CSW also spoke with Dr. Ferol Luz regarding this plan as above. Reece Levy, MSW, Theresia Majors [phone]

## 2012-11-23 NOTE — Progress Notes (Signed)
SUBJECTIVE:  Had episode of tachcyardia last pm otherwise no complaints  OBJECTIVE:   Vitals:   Filed Vitals:   11/22/12 1504 11/22/12 2126 11/23/12 0258 11/23/12 0442  BP: 125/74 114/72 114/69 112/70  Pulse: 68 65 69 70  Temp: 98.5 F (36.9 C) 98.7 F (37.1 C) 97.5 F (36.4 C) 97.8 F (36.6 C)  TempSrc: Oral Oral Oral Oral  Resp: 18 16 18 20   Height:      Weight:      SpO2: 96% 97% 97% 95%   I&O's:   Intake/Output Summary (Last 24 hours) at 11/23/12 1610 Last data filed at 11/23/12 9604  Gross per 24 hour  Intake    900 ml  Output    302 ml  Net    598 ml   TELEMETRY: Reviewed telemetry pt in NSR:     PHYSICAL EXAM General: Well developed, well nourished, in no acute distress Head: Eyes PERRLA, No xanthomas.   Normal cephalic and atramatic  Lungs:   Clear bilaterally to auscultation and percussion. Heart:   HRRR S1 S2 Pulses are 2+ & equal. Abdomen: Bowel sounds are positive, abdomen soft and non-tender without masses  Extremities:   No clubbing, cyanosis or edema.  DP +1 Neuro: Alert and oriented X 3. Psych:  Good affect, responds appropriately   LABS: Basic Metabolic Panel:  Recent Labs  54/09/81 0500 11/22/12 0630 11/23/12 0455  NA 142 141 141  K 3.6 3.9 4.0  CL 107 106 105  CO2 22 26 27   GLUCOSE 86 94 91  BUN 15 17 18   CREATININE 0.71 0.74 0.81  CALCIUM 9.4 9.8 9.4  MG 2.0  --   --   PHOS 4.0  --   --    CBC:  Recent Labs  11/22/12 0630 11/23/12 0455  WBC 7.7 9.3  HGB 15.0 15.1  HCT 42.7 45.1  MCV 90.3 91.5  PLT 206 233     RADIOLOGY: Ct Head Wo Contrast  11/15/2012  *RADIOLOGY REPORT*  Clinical Data: Unresponsive.  Altered mental status.  CT HEAD WITHOUT CONTRAST  Technique:  Contiguous axial images were obtained from the base of the skull through the vertex without contrast.  Comparison: None.  Findings: The brain has a normal appearance without evidence of malformation, atrophy, old or acute infarction, mass lesion, hemorrhage,  hydrocephalus or significant extra-axial collection. 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  No skull fracture.  There are some mucosal inflammatory changes of the sinuses, most notable on the right maxillary sinus.  IMPRESSION: Normal appearance of the brain.  Some sinus inflammation, particularly of the right maxillary sinus.   Original Report Authenticated By: Paulina Fusi, M.D.    Dg Chest Port 1 View  11/17/2012  *RADIOLOGY REPORT*  Clinical Data: Evaluate endotracheal tube, shortness of breath  PORTABLE CHEST - 1 VIEW  Comparison: 11/15/2012  Findings:  Grossly unchanged cardiac silhouette and mediastinal contours. Endotracheal tube overlies the tracheal air column with tip superior to the carina.  Interval placement of enteric tube which appears coiled over the gastric fundus. Minimal infrahilar heterogeneous atelectasis.  No focal airspace opacities.  No supine evidence of pneumothorax or pleural effusion.  Grossly unchanged bones.  IMPRESSION: 1.  Appropriately positioned support apparatus as above.  No pneumothorax. 2.  No acute cardiopulmonary disease.   Original Report Authenticated By: Tacey Ruiz, MD    Dg Chest [facility]  11/15/2012  *RADIOLOGY REPORT*  Clinical Data: Respiratory  distress.  Status post intubation.  PORTABLE CHEST - 1 VIEW  Comparison: None  Findings: The cardiomediastinal silhouette is unremarkable. An endotracheal tube is identified with tip 4.5 cm above the carina. The lungs are clear. There is no evidence of focal airspace disease, pulmonary edema, suspicious pulmonary nodule/mass, pleural effusion, or pneumothorax. No acute bony abnormalities are identified.  IMPRESSION: Endotracheal tube placement without evidence of active cardiopulmonary disease.   Original Report Authenticated By: Harmon Pier, M.D.    ASSESSMENT:  1. Elevated troponin c/w NSTEMI most likely stress induced from Heroin overdose. He has deeply inverted T waves in  the anterior leads and mildly reduced LVF with EF 40-45 % with apical HK which may represent Takotsubo CM. Interestingly his CPK was significantly elevated with a normal MB making acute coronary syndrome unlikely. Repeat 2D echo yesterday showed resolution of LV dysfunction.  EF 55% with no wall motion abnormalities. 2. Heroin overdose  3. Hepatits C  4. Acute renal failure resolved  5.  Transient sinus tachycardia resolved PLAN:  1.Discussed with colleagues - given patient's age it is unlikely that he has underlying CAD. He has never had chest pain. His cardiomyopathy is now resolved by repeat echo with normal LVF 2.  Nuclear stress test prior to d/c home     Quintella Reichert, MD  11/23/2012  8:28 AM

## 2012-11-24 ENCOUNTER — Inpatient Hospital Stay (HOSPITAL_COMMUNITY): Payer: MEDICAID

## 2012-11-24 LAB — CBC
MCH: 31.2 pg (ref 26.0–34.0)
MCHC: 34.4 g/dL (ref 30.0–36.0)
Platelets: 244 10*3/uL (ref 150–400)

## 2012-11-24 LAB — BASIC METABOLIC PANEL
Calcium: 9.5 mg/dL (ref 8.4–10.5)
GFR calc Af Amer: >90 mL/min (ref >90)
GFR calc non Af Amer: >90 mL/min (ref >90)
Sodium: 139 mEq/L (ref 135–145)

## 2012-11-24 MED ORDER — TECHNETIUM TC 99M SESTAMIBI GENERIC - CARDIOLITE
30.0000 | Freq: Once | INTRAVENOUS | Status: AC | PRN
  Administered 2012-11-24: 30 via INTRAVENOUS

## 2012-11-24 MED ORDER — IBUPROFEN 600 MG PO TABS: 600.0000 mg | ORAL_TABLET | Freq: Four times a day (QID) | ORAL | Status: DC | PRN

## 2012-11-24 MED ORDER — ASPIRIN 81 MG PO CHEW: 81.0000 mg | CHEWABLE_TABLET | Freq: Every day | ORAL | Status: DC

## 2012-11-24 MED ORDER — TECHNETIUM TC 99M SESTAMIBI GENERIC - CARDIOLITE
10.0000 | Freq: Once | INTRAVENOUS | Status: AC | PRN
  Administered 2012-11-24: 10 via INTRAVENOUS

## 2012-11-24 MED ORDER — REGADENOSON 0.4 MG/5ML IV SOLN
0.4000 mg | Freq: Once | INTRAVENOUS | Status: AC
  Administered 2012-11-24: 0.4 mg via INTRAVENOUS
  Filled 2012-11-24: qty 5

## 2012-11-24 MED ORDER — METOPROLOL TARTRATE 12.5 MG HALF TABLET: 12.5000 mg | ORAL_TABLET | Freq: Two times a day (BID) | ORAL | Status: DC

## 2012-11-24 MED ORDER — TRAZODONE HCL 50 MG PO TABS: 50.0000 mg | ORAL_TABLET | Freq: Every day | ORAL | Status: DC

## 2012-11-24 MED ORDER — ACETAMINOPHEN 325 MG PO TABS: 650.0000 mg | ORAL_TABLET | Freq: Four times a day (QID) | ORAL | Status: DC | PRN
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
## 2012-11-24 NOTE — Clinical Social Work Note (Signed)
CSW completed rounds with medical team this am. CSW spoke to the patient's mother to discuss d/c. Patient's mother stated she was aware of appointments set up by Psych CSW [facility] for CDIOP- orientation on Thursday, March 6th at 10am and Inpatient at [facility] on March 13th), and had them written down. Patient's mother thanked CSW and medical team for assisting in care of son. CSW signing off, no other psychosocial concerns identified. Please re-consult as needed.  Lia Foyer, LCSWA [facility] Clinical Social Worker Contact #: [phone]

## 2012-11-24 NOTE — Progress Notes (Signed)
Patient off the floor for stress test- mother at bedside awaiting his return. Mom feels quite relieved to have him set up with outpatient psych at d/c until [facility] Inpatient has an opening-  Chatted with her about how she is handling everything and she shared with me that the patient as well as herself have struggled over the years with her daughter/his sister's suicide- she was 60, he was 75 when she shot herself. Mom states they have struggled with grief, guilt and loss since her death in 66. She states they had grief counseling back then and I have encouraged her to consider this again for herself and/or him. She shared with me the fear she has of losing another child. Also encouraged her to consider some sort of support for herself as a family member via an AL-Anon, Tough love type program. She was very appreciative of my visit and support offered. Reece Levy, MSW, Theresia Majors [phone]

## 2012-11-24 NOTE — Progress Notes (Signed)
PATIENT DETAILS Name: Austin Nielsen Age: 27 y.o. Sex: male Date of Birth: [birth year] Admit Date: 11/15/2012 Admitting Physician Curlene Dolphin, MD PCP:Pcp Not In System  Subjective: No major issues  Assessment/Plan: Active Problems: Acute Respiratory Failure -2/2 drug overdose (Heroin) - intubated on 2/23-extubated 2/17 -currently stable and does not require O2  Acute Encephalopathy -from toxic encephalopathy-from heroin overdose -CT Head on 2/23-negative -resolved  NSTEMI/Takasubo cardiomyopathy  -cards following -on ASA. Metoprolol -Echo 2/24-EF 40-45 %, with hypokinetic Apex - repeat echo on 3/2-apical hypokinesis has resolved -await results of Nuclear Stress Test  Heroin Dependence -seen by psych on 2/28-per Dr Blenda Peals note-patient lacks capacity-however Psych social worker discussed with Dr Ferol Luz on 3/3-ok to discharge with outpatient psych resources till patient gets a inpatient bed at [facility] 3/13    Hepatitis C -further w/u to be done in the outpatient setting  Disposition: Remain inpatient-later today if nuclear stress test is negative  DVT Prophylaxis: Prophylactic Lovenox  Code Status: Full code   Procedures:  None  CONSULTS:  cardiology, pulmonary/intensive care, neurology and psychiatry  PHYSICAL EXAM: Vital signs in last 24 hours: Filed Vitals:   11/23/12 1000 11/23/12 1438 11/23/12 2116 11/24/12 0520  BP: 113/68 118/67 120/76 118/71  Pulse: 66 66 68 65  Temp:  98.3 F (36.8 C) 98.1 F (36.7 C) 98.8 F (37.1 C)  TempSrc:  Oral Oral Oral  Resp:  20 18 20   Height:      Weight:      SpO2:  97% 98% 97%    Weight change:  Body mass index is 23.05 kg/(m^2).   Gen Exam: Awake and alert with clear speech.   Neck: Supple, No JVD.   Chest: B/L Clear.  No rales or rhonchi CVS: S1 S2 Regular, no murmurs.  Abdomen: soft, BS +, non tender, non distended.  Extremities: no edema, lower extremities warm to touch. Neurologic:  Non Focal.   Skin: No Rash.   Wounds: N/A.   Intake/Output from previous day:  Intake/Output Summary (Last 24 hours) at 11/24/12 0956 Last data filed at 11/24/12 0300  Gross per 24 hour  Intake   1110 ml  Output    950 ml  Net    160 ml     LAB RESULTS: CBC  Recent Labs Lab 11/20/12 0431 11/21/12 0500 11/22/12 0630 11/23/12 0455 11/24/12 0605  WBC 6.3 6.8 7.7 9.3 7.7  HGB 12.8* 14.4 15.0 15.1 15.3  HCT 36.9* 40.8 42.7 45.1 44.5  PLT 141* 176 206 233 244  MCV 92.0 90.1 90.3 91.5 90.8  MCH 31.9 31.8 31.7 30.6 31.2  MCHC 34.7 35.3 35.1 33.5 34.4  RDW 11.8 11.9 12.1 12.2 12.2    Chemistries   Recent Labs Lab 11/18/12 0130 11/19/12 0440 11/20/12 0431 11/21/12 0500 11/22/12 0630 11/23/12 0455 11/24/12 0605  NA 142 145 143 142 141 141 139  K 3.3* 3.8 3.3* 3.6 3.9 4.0 4.1  CL 106 110 108 107 106 105 103  CO2 24 22 24 22 26 27 27   GLUCOSE 94 107* 83 86 94 91 93  BUN 12 17 14 15 17 18 18   CREATININE 0.85 0.87 0.75 0.71 0.74 0.81 0.79  CALCIUM 8.8 9.1 9.1 9.4 9.8 9.4 9.5  MG 2.0 2.0  --  2.0  --   --   --     CBG:  Recent Labs Lab 11/17/12 1226  GLUCAP 118*    GFR Estimated Creatinine Clearance: 149.9 ml/min (by C-G formula based on  Cr of 0.79).  Coagulation profile No results found for this basename: INR, PROTIME,  in the last 168 hours  Cardiac Enzymes  Recent Labs Lab 11/17/12 1509  CKMB 4.0    No components found with this basename: POCBNP,  No results found for this basename: DDIMER,  in the last 72 hours No results found for this basename: HGBA1C,  in the last 72 hours No results found for this basename: CHOL, HDL, LDLCALC, TRIG, CHOLHDL, LDLDIRECT,  in the last 72 hours No results found for this basename: TSH, T4TOTAL, FREET3, T3FREE, THYROIDAB,  in the last 72 hours No results found for this basename: VITAMINB12, FOLATE, FERRITIN, TIBC, IRON, RETICCTPCT,  in the last 72 hours No results found for this basename: LIPASE, AMYLASE,  in the  last 72 hours  Urine Studies No results found for this basename: UACOL, UAPR, USPG, UPH, UTP, UGL, UKET, UBIL, UHGB, UNIT, UROB, ULEU, UEPI, UWBC, URBC, UBAC, CAST, CRYS, UCOM, BILUA,  in the last 72 hours  MICROBIOLOGY: Recent Results (from the past 240 hour(s))  CULTURE, BLOOD (ROUTINE X 2)     Status: None   Collection Time    11/15/12  9:25 PM      Result Value Range Status   Specimen Description BLOOD RIGHT ARM   Final   Special Requests BOTTLES DRAWN AEROBIC AND ANAEROBIC 10CC EACH   Final   Culture  Setup Time 11/16/2012 02:00   Final   Culture NO GROWTH 5 DAYS   Final   Report Status 11/22/2012 FINAL   Final  CULTURE, BLOOD (ROUTINE X 2)     Status: None   Collection Time    11/15/12  9:35 PM      Result Value Range Status   Specimen Description BLOOD RIGHT HAND   Final   Special Requests BOTTLES DRAWN AEROBIC ONLY 10CC   Final   Culture  Setup Time 11/16/2012 02:00   Final   Culture NO GROWTH 5 DAYS   Final   Report Status 11/22/2012 FINAL   Final  MRSA PCR SCREENING     Status: None   Collection Time    11/15/12  9:37 PM      Result Value Range Status   MRSA by PCR NEGATIVE  NEGATIVE Final   Comment:            The GeneXpert MRSA Assay (FDA     approved for NASAL specimens     only), is one component of a     comprehensive MRSA colonization     surveillance program. It is not     intended to diagnose MRSA     infection nor to guide or     monitor treatment for     MRSA infections.    RADIOLOGY STUDIES/RESULTS: Ct Head Wo Contrast  11/15/2012  *RADIOLOGY REPORT*  Clinical Data: Unresponsive.  Altered mental status.  CT HEAD WITHOUT CONTRAST  Technique:  Contiguous axial images were obtained from the base of the skull through the vertex without contrast.  Comparison: None.  Findings: The brain has a normal appearance without evidence of malformation, atrophy, old or acute infarction, mass lesion, hemorrhage, hydrocephalus or significant extra-axial collection.  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  No skull fracture.  There are some mucosal inflammatory changes of the sinuses, most notable on the right maxillary sinus.  IMPRESSION: Normal appearance of the brain.  Some sinus inflammation, particularly of the right maxillary sinus.  Original Report Authenticated By: Paulina Fusi, M.D.    Dg Chest Port 1 View  11/17/2012  *RADIOLOGY REPORT*  Clinical Data: Evaluate endotracheal tube, shortness of breath  PORTABLE CHEST - 1 VIEW  Comparison: 11/15/2012  Findings:  Grossly unchanged cardiac silhouette and mediastinal contours. Endotracheal tube overlies the tracheal air column with tip superior to the carina.  Interval placement of enteric tube which appears coiled over the gastric fundus. Minimal infrahilar heterogeneous atelectasis.  No focal airspace opacities.  No supine evidence of pneumothorax or pleural effusion.  Grossly unchanged bones.  IMPRESSION: 1.  Appropriately positioned support apparatus as above.  No pneumothorax. 2.  No acute cardiopulmonary disease.   Original Report Authenticated By: Tacey Ruiz, MD    Dg Chest [facility]  11/15/2012  *RADIOLOGY REPORT*  Clinical Data: Respiratory distress.  Status post intubation.  PORTABLE CHEST - 1 VIEW  Comparison: None  Findings: The cardiomediastinal silhouette is unremarkable. An endotracheal tube is identified with tip 4.5 cm above the carina. The lungs are clear. There is no evidence of focal airspace disease, pulmonary edema, suspicious pulmonary nodule/mass, pleural effusion, or pneumothorax. No acute bony abnormalities are identified.  IMPRESSION: Endotracheal tube placement without evidence of active cardiopulmonary disease.   Original Report Authenticated By: Harmon Pier, M.D.     MEDICATIONS: Scheduled Meds: . antiseptic oral rinse  15 mL Mouth Rinse BID  . aspirin  81 mg Oral Daily  . enoxaparin (LOVENOX) injection  40 mg Subcutaneous Q24H  . feeding  supplement  1 Container Oral TID BM  . metoprolol tartrate  12.5 mg Oral BID  . traZODone  50 mg Oral QHS   Continuous Infusions:  PRN Meds:.acetaminophen, ibuprofen, morphine injection, zolpidem  Antibiotics: Anti-infectives   Start     Dose/Rate Route Frequency Ordered Stop   11/16/12 0600  piperacillin-tazobactam (ZOSYN) IVPB 3.375 g  Status:  Discontinued     3.375 g 12.5 mL/hr over 240 Minutes Intravenous 3 times per day 11/15/12 2209 11/19/12 1044   11/15/12 2300  vancomycin (VANCOCIN) IVPB 1000 mg/200 mL premix  Status:  Discontinued     1,000 mg 200 mL/hr over 60 Minutes Intravenous Every 12 hours 11/15/12 2209 11/17/12 1234   11/15/12 2215  piperacillin-tazobactam (ZOSYN) IVPB 3.375 g     3.375 g 100 mL/hr over 30 Minutes Intravenous NOW 11/15/12 2209 11/15/12 2330       Jeoffrey Massed, MD  [facility] Pager:[phone]  If 7PM-7AM, please contact night-coverage [email or web address] Password [email or web address] 11/24/2012, 9:56 AM   LOS: 9 days

## 2012-11-24 NOTE — Discharge Summary (Signed)
PATIENT DETAILS Name: Austin Nielsen Age: 27 y.o. Sex: male Date of Birth: 1985/10/14 MRN: [id number]. Admit Date: 11/15/2012 Admitting Physician: Curlene Dolphin, MD PCP:Pcp Not In System  Recommendations for Outpatient Follow-up:  1. General health maintenance 2. Counsel against further drug use  PRIMARY DISCHARGE DIAGNOSIS:  Active Problems:   Hepatitis C   NSTEMI (non-ST elevated myocardial infarction)   DCM (dilated cardiomyopathy)   Opioid dependence      PAST MEDICAL HISTORY: Past Medical History  Diagnosis Date  . Hepatitis C     DISCHARGE MEDICATIONS:   Medication List    STOP taking these medications       ADVIL COLD/SINUS PO      TAKE these medications       acetaminophen 325 MG tablet  Commonly known as:  TYLENOL  Take 2 tablets (650 mg total) by mouth every 6 (six) hours as needed for pain.     aspirin 81 MG chewable tablet  Chew 1 tablet (81 mg total) by mouth daily.     ibuprofen 600 MG tablet  Commonly known as:  ADVIL,MOTRIN  Take 1 tablet (600 mg total) by mouth every 6 (six) hours as needed for pain.     metoprolol tartrate 12.5 mg Tabs  Commonly known as:  LOPRESSOR  Take 0.5 tablets (12.5 mg total) by mouth 2 (two) times daily.     traZODone 50 MG tablet  Commonly known as:  DESYREL  Take 1 tablet (50 mg total) by mouth at bedtime.         BRIEF HPI:  See H&P, Labs, Consult and Test reports for all details in brief, 27 years old male with PMH relevant for hepatitis C and drug abuse. Presents brought by EMS after being found unresponsive.At admission he was unresponsive and was intubated for airway protection. He had pin point pupils, had some posturing movements as per the ED physicians and was tachycardic and hypertensive. There were multiple needle marks in his right arm  CONSULTATIONS:   cardiology and pulmonary/intensive care  PERTINENT RADIOLOGIC STUDIES: Dg Shoulder Right  11/24/2012  *RADIOLOGY REPORT*  Clinical  Data: Right shoulder pain.  Recent fall.  History of rotator cuff tear.  RIGHT SHOULDER - 2+ VIEW  Comparison: None.  Findings: No fracture or dislocation.  Visualized lungs clear  IMPRESSION: .  No fracture or dislocation.   Original Report Authenticated By: Lacy Duverney, M.D.    Ct Head Wo Contrast  11/15/2012  *RADIOLOGY REPORT*  Clinical Data: Unresponsive.  Altered mental status.  CT HEAD WITHOUT CONTRAST  Technique:  Contiguous axial images were obtained from the base of the skull through the vertex without contrast.  Comparison: None.  Findings: The brain has a normal appearance without evidence of malformation, atrophy, old or acute infarction, mass lesion, hemorrhage, hydrocephalus or significant extra-axial collection. 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  No skull fracture.  There are some mucosal inflammatory changes of the sinuses, most notable on the right maxillary sinus.  IMPRESSION: Normal appearance of the brain.  Some sinus inflammation, particularly of the right maxillary sinus.   Original Report Authenticated By: Paulina Fusi, M.D.    Nm Myocar Single W/spect W/wall Motion And Ef  11/24/2012  *RADIOLOGY REPORT*  Clinical Data:  Chest pain.  Positive enzymes.  MYOCARDIAL IMAGING WITH SPECT (REST AND PHARMACOLOGIC-STRESS) GATED LEFT VENTRICULAR WALL MOTION STUDY LEFT VENTRICULAR EJECTION FRACTION  Technique:  Standard myocardial SPECT imaging was performed after  resting intravenous injection of 10 mCi Tc-1m 30. Subsequently, intravenous infusion of sestamibi was performed under the supervision of the Cardiology staff.  At peak effect of the drug, 30 mCi Tc-41m sestamibi was injected intravenously and standard myocardial SPECT  imaging was performed.  Quantitative gated imaging was also performed to evaluate left ventricular wall motion, and estimate left ventricular ejection fraction.  Comparison:  None  Findings: The SPECT stress and rest images  demonstrate no fixed or reversible defects to suggest infarction or ischemia.  The gated SPECT images demonstrate normal wall motion and myocardial thickening.  The estimated ejection fraction was 59%.  IMPRESSION:  1.  No findings for ischemia or infarction. 2.  Normal wall motion and myocardial thickening with contraction. 3.  Ejection fracture estimated at 59%.   Original Report Authenticated By: Rudie Meyer, M.D.    Dg Chest Port 1 View  11/17/2012  *RADIOLOGY REPORT*  Clinical Data: Evaluate endotracheal tube, shortness of breath  PORTABLE CHEST - 1 VIEW  Comparison: 11/15/2012  Findings:  Grossly unchanged cardiac silhouette and mediastinal contours. Endotracheal tube overlies the tracheal air column with tip superior to the carina.  Interval placement of enteric tube which appears coiled over the gastric fundus. Minimal infrahilar heterogeneous atelectasis.  No focal airspace opacities.  No supine evidence of pneumothorax or pleural effusion.  Grossly unchanged bones.  IMPRESSION: 1.  Appropriately positioned support apparatus as above.  No pneumothorax. 2.  No acute cardiopulmonary disease.   Original Report Authenticated By: Tacey Ruiz, MD    Dg Chest [facility]  11/15/2012  *RADIOLOGY REPORT*  Clinical Data: Respiratory distress.  Status post intubation.  PORTABLE CHEST - 1 VIEW  Comparison: None  Findings: The cardiomediastinal silhouette is unremarkable. An endotracheal tube is identified with tip 4.5 cm above the carina. The lungs are clear. There is no evidence of focal airspace disease, pulmonary edema, suspicious pulmonary nodule/mass, pleural effusion, or pneumothorax. No acute bony abnormalities are identified.  IMPRESSION: Endotracheal tube placement without evidence of active cardiopulmonary disease.   Original Report Authenticated By: Harmon Pier, M.D.      PERTINENT LAB RESULTS: CBC:  Recent Labs  11/23/12 0455 11/24/12 0605  WBC 9.3 7.7  HGB 15.1 15.3  HCT 45.1 44.5  PLT  233 244   CMET CMP     Component Value Date/Time   NA 139 11/24/2012 0605   K 4.1 11/24/2012 0605   CL 103 11/24/2012 0605   CO2 27 11/24/2012 0605   GLUCOSE 93 11/24/2012 0605   BUN 18 11/24/2012 0605   CREATININE 0.79 11/24/2012 0605   CALCIUM 9.5 11/24/2012 0605   PROT 7.3 11/15/2012 1839   ALBUMIN 3.9 11/15/2012 1839   AST 33 11/15/2012 1839   ALT 18 11/15/2012 1839   ALKPHOS 85 11/15/2012 1839   BILITOT 0.2* 11/15/2012 1839   GFRNONAA >90 11/24/2012 0605   GFRAA >90 11/24/2012 0605    GFR Estimated Creatinine Clearance: 149.9 ml/min (by C-G formula based on Cr of 0.79). No results found for this basename: LIPASE, AMYLASE,  in the last 72 hours No results found for this basename: CKTOTAL, CKMB, CKMBINDEX, TROPONINI,  in the last 72 hours No components found with this basename: POCBNP,  No results found for this basename: DDIMER,  in the last 72 hours No results found for this basename: HGBA1C,  in the last 72 hours No results found for this basename: CHOL, HDL, LDLCALC, TRIG, CHOLHDL, LDLDIRECT,  in the last 72 hours No results  found for this basename: TSH, T4TOTAL, FREET3, T3FREE, THYROIDAB,  in the last 72 hours No results found for this basename: VITAMINB12, FOLATE, FERRITIN, TIBC, IRON, RETICCTPCT,  in the last 72 hours Coags: No results found for this basename: PT, INR,  in the last 72 hours Microbiology: Recent Results (from the past 240 hour(s))  CULTURE, BLOOD (ROUTINE X 2)     Status: None   Collection Time    11/15/12  9:25 PM      Result Value Range Status   Specimen Description BLOOD RIGHT ARM   Final   Special Requests BOTTLES DRAWN AEROBIC AND ANAEROBIC 10CC EACH   Final   Culture  Setup Time 11/16/2012 02:00   Final   Culture NO GROWTH 5 DAYS   Final   Report Status 11/22/2012 FINAL   Final  CULTURE, BLOOD (ROUTINE X 2)     Status: None   Collection Time    11/15/12  9:35 PM      Result Value Range Status   Specimen Description BLOOD RIGHT HAND   Final   Special Requests  BOTTLES DRAWN AEROBIC ONLY 10CC   Final   Culture  Setup Time 11/16/2012 02:00   Final   Culture NO GROWTH 5 DAYS   Final   Report Status 11/22/2012 FINAL   Final  MRSA PCR SCREENING     Status: None   Collection Time    11/15/12  9:37 PM      Result Value Range Status   MRSA by PCR NEGATIVE  NEGATIVE Final   Comment:            The GeneXpert MRSA Assay (FDA     approved for NASAL specimens     only), is one component of a     comprehensive MRSA colonization     surveillance program. It is not     intended to diagnose MRSA     infection nor to guide or     monitor treatment for     MRSA infections.     BRIEF HOSPITAL COURSE:   Active Problems: Acute Respiratory Failure  -2/2 drug overdose (Heroin)  - intubated on 2/23-extubated 2/17  -currently stable and does not require O2  Acute Encephalopathy  -from toxic encephalopathy-from heroin overdose  -CT Head on 2/23-negative  -resolved  NSTEMI/Takasubo cardiomyopathy  -cards following  -on ASA. Metoprolol  -Echo 2/24-EF 40-45 %, with hypokinetic Apex  - repeat echo on 3/2-apical hypokinesis has resolved  -Nuclear Stress Test neg for ischemia  Heroin Dependence  -seen by psych on 2/28-per Dr Blenda Peals note-patient lacks capacity-however Psych social worker discussed with Dr Ferol Luz on 3/3-ok to discharge with outpatient psych resources till patient gets a inpatient bed at [facility] 3/13  -I personally spoke with Dr Ferol Luz today-who said the patient was much better-and was ok to discharge.  Known Right Rotator cuff injury -patient apparently has seen Ortho at [facility] for this problem, he currently has some difficulty raising his Right arm beyond his shoulder level. He has no weakness however,Xray was neg for fracture or disclocation. I have asked him to follow up with his primary orthopod at [facility]  TODAY-DAY OF DISCHARGE:  Subjective:   Colyn Miron today has no headache,no chest abdominal pain,no new weakness  tingling or numbness, feels much better wants to go home today.  Objective:   Blood pressure 128/78, pulse 96, temperature 98.7 F (37.1 C), temperature source Oral, resp. rate 20, height 6' (1.829 m), weight 77.1 kg (169  lb 15.6 oz), SpO2 98.00%.  Intake/Output Summary (Last 24 hours) at 11/24/12 1641 Last data filed at 11/24/12 0300  Gross per 24 hour  Intake    888 ml  Output    950 ml  Net    -62 ml    Exam Awake Alert, Oriented *3, No new F.N deficits, Normal affect [place].AT,PERRAL Supple Neck,No JVD, No cervical lymphadenopathy appriciated.  Symmetrical Chest wall movement, Good air movement bilaterally, CTAB RRR,No Gallops,Rubs or new Murmurs, No Parasternal Heave +ve B.Sounds, Abd Soft, Non tender, No organomegaly appriciated, No rebound -guarding or rigidity. No Cyanosis, Clubbing or edema, No new Rash or bruise  DISCHARGE CONDITION: Stable  DISPOSITION: HOME  DISCHARGE INSTRUCTIONS:    Activity:  As tolerated   Diet recommendation: Heart Healthy diet       Follow-up Information   Follow up with [facility] On 12/02/2012. (5 pm- please bring $20 copay and any other medical insurance. )    Contact information:   [address] [place] [address] [phone]      Please follow up. (FOLLOW UP WITH YOUR ORTHOPEDIC MD AT [facility] TO GET EVALUATED FOR RIGHT ROTATOR CUFF INJURY)         Total Time spent on discharge equals 45 minutes.  SignedJeoffrey Massed 11/24/2012 4:41 PM

## 2012-11-24 NOTE — Progress Notes (Signed)
1720 Discharge instructions reviewed with patient and mother verbalize and understands. Prescriptions given to mother. Skin WNL Patient left  Floor in wheelchair accompanied by staff and mother.

## 2012-11-24 NOTE — Care Management Note (Signed)
    Page 1 of 1   11/24/2012     12:54:16 PM   CARE MANAGEMENT NOTE 11/24/2012  Patient:  Austin Nielsen, Austin Nielsen   Account Number:  [id number]  Date Initiated:  11/16/2012  Documentation initiated by:  Avie Arenas  Subjective/Objective Assessment:   Found unresponsive - intubated.     Action/Plan:   physch eval- rec outpt f/u then on 3/16 will go to [facility].   Anticipated DC Date:  11/24/2012   Anticipated DC Plan:  HOME/SELF CARE  In-house referral  Clinical Social Worker      DC Planning Services  CM consult      Choice offered to / List presented to:             Status of service:  Completed, signed off Medicare Important Message given?   (If response is "NO", the following Medicare IM given date fields will be blank) Date Medicare IM given:   Date Additional Medicare IM given:    Discharge Disposition:  HOME/SELF CARE  Per UR Regulation:  Reviewed for med. necessity/level of care/duration of stay  If discussed at Long Length of Stay Meetings, dates discussed:    Comments:  ContactBennett, Ram Mother [phone]  11/24/12 12:43 Letha Cape RN, BSN [phone] patient is for dc today if stress test is ok which he is having today.  Patient will go to outpt physch then inpatint on 3/16 at [facility] per CSW.   Patient is scheduled for a f/u apt at urgent care.  Floor RN will go over this with him during dc instructions.   Patient's mom is his transportation and she states she  will help him with getting his medications.

## 2012-11-26 ENCOUNTER — Encounter (HOSPITAL_COMMUNITY): Payer: Self-pay | Admitting: Emergency Medicine

## 2012-11-26 ENCOUNTER — Emergency Department (INDEPENDENT_AMBULATORY_CARE_PROVIDER_SITE_OTHER)
Admission: EM | Admit: 2012-11-26 | Discharge: 2012-11-26 | Disposition: A | Discharge status: Home / Self Care | Payer: Self-pay | Source: Home / Self Care

## 2012-11-26 DIAGNOSIS — M25519 Pain in unspecified shoulder: Secondary | ICD-10-CM

## 2012-11-26 DIAGNOSIS — M25511 Pain in right shoulder: Secondary | ICD-10-CM

## 2012-11-26 HISTORY — DX: Essential (primary) hypertension: I10

## 2012-11-26 MED ORDER — IBUPROFEN 600 MG PO TABS: 600.0000 mg | ORAL_TABLET | Freq: Four times a day (QID) | ORAL | Status: DC | PRN

## 2012-11-26 MED ORDER — ACETAMINOPHEN 325 MG PO TABS: 650.0000 mg | ORAL_TABLET | Freq: Three times a day (TID) | ORAL | Status: DC | PRN

## 2012-11-26 NOTE — ED Notes (Signed)
Patient Demographics  Baldemar Dady, is a 27 y.o. male  ZOX:[id number]  [id number]:[id number]  DOB - 1986/05/01  Chief Complaint  Patient presents with  . Shoulder Pain        Subjective:   Harlene Ramus today has, No headache, No chest pain, No abdominal pain - No Nausea, No new weakness tingling or numbness, No Cough - SOB. Chronic 6-year-old right shoulder pain for which he follows with [facility] orthopedics residency clinic.  Objective:    Filed Vitals:   11/26/12 1502  BP: 117/75  Pulse: 87  Temp: 97.6 F (36.4 C)  TempSrc: Oral  Resp: 18  SpO2: 100%     Exam  Awake Alert, Oriented X 3, No new F.N deficits, Normal affect [place].AT,PERRAL Supple Neck,No JVD, No cervical lymphadenopathy appriciated.  Symmetrical Chest wall movement, Good air movement bilaterally, CTAB RRR,No Gallops,Rubs or new Murmurs, No Parasternal Heave +ve B.Sounds, Abd Soft, Non tender, No organomegaly appriciated, No rebound - guarding or rigidity. No Cyanosis, Clubbing or edema, No new Rash or bruise Right shoulder has no effusion no erythema no warmth, note passive range of motion was completely pain-free while patient was distracted.    Data Review   CBC  Recent Labs Lab 11/20/12 0431 11/21/12 0500 11/22/12 0630 11/23/12 0455 11/24/12 0605  WBC 6.3 6.8 7.7 9.3 7.7  HGB 12.8* 14.4 15.0 15.1 15.3  HCT 36.9* 40.8 42.7 45.1 44.5  PLT 141* 176 206 233 244  MCV 92.0 90.1 90.3 91.5 90.8  MCH 31.9 31.8 31.7 30.6 31.2  MCHC 34.7 35.3 35.1 33.5 34.4  RDW 11.8 11.9 12.1 12.2 12.2    Chemistries    Recent Labs Lab 11/20/12 0431 11/21/12 0500 11/22/12 0630 11/23/12 0455 11/24/12 0605  NA 143 142 141 141 139  K 3.3* 3.6 3.9 4.0 4.1  CL 108 107 106 105 103  CO2 24 22 26 27 27   GLUCOSE 83 86 94 91 93  BUN 14 15 17 18 18   CREATININE 0.75 0.71 0.74 0.81 0.79  CALCIUM 9.1 9.4 9.8 9.4 9.5  MG  --  2.0  --   --   --     ------------------------------------------------------------------------------------------------------------------ No results found for this basename: HGBA1C,  in the last 72 hours ------------------------------------------------------------------------------------------------------------------ No results found for this basename: CHOL, HDL, LDLCALC, TRIG, CHOLHDL, LDLDIRECT,  in the last 72 hours ------------------------------------------------------------------------------------------------------------------ No results found for this basename: TSH, T4TOTAL, FREET3, T3FREE, THYROIDAB,  in the last 72 hours ------------------------------------------------------------------------------------------------------------------ No results found for this basename: VITAMINB12, FOLATE, FERRITIN, TIBC, IRON, RETICCTPCT,  in the last 72 hours  Coagulation profile  No results found for this basename: INR, PROTIME,  in the last 168 hours     Prior to Admission medications   Medication Sig Start Date End Date Taking? Authorizing Provider  metoprolol tartrate (LOPRESSOR) 12.5 mg TABS Take 0.5 tablets (12.5 mg total) by mouth 2 (two) times daily. 11/24/12  Yes Shanker Levora Dredge, MD  traZODone (DESYREL) 50 MG tablet Take 1 tablet (50 mg total) by mouth at bedtime. 11/24/12  Yes Shanker Levora Dredge, MD  acetaminophen (TYLENOL) 325 MG tablet Take 2 tablets (650 mg total) by mouth every 8 (eight) hours as needed for pain. 11/26/12   Leroy Sea, MD  aspirin 81 MG chewable tablet Chew 1 tablet (81 mg total) by mouth daily. 11/24/12   Shanker Levora Dredge, MD  ibuprofen (ADVIL,MOTRIN) 600 MG tablet Take 1 tablet (600 mg total) by mouth every 6 (six) hours as needed for pain.  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 History  of right shoulder rotator cuff injury for which he has been following with orthopedic residency clinic at [facility] for the last 4 years.  Patient here to followup on his right shoulder pain, he wanted to get some narcotic prescription but was told it is the clinic policy not to prescribe opioids for chronic pain, his recent x-ray done a few days ago at [facility] was reviewed which did not show any acute changes. His exam of the right shoulder stated above was essentially unremarkable. He was requested to continue taking Motrin as above and Tylenol at slightly reduced dose as compared to before. Then to follow with his orthopedic surgeon at [facility].  I will order lipid panel, TSH, BMP for him for his routine health maintenance in regards to his dilated cardiomyopathy. He will come back in 3-4 weeks and get lab work done a day before his next visit.    Follow-up Information   Follow up with Primary care provider. Schedule an appointment as soon as possible for a visit in 2 weeks. (Recheck provider in 2-3 weeks followup on the lab work ordered)        Leroy Sea M.D on 11/26/2012 at 3:10 PM  Leroy Sea, MD 11/26/12 1515

## 2012-11-26 NOTE — ED Notes (Signed)
Pt is here c/o right shoulder pain Reports being hospitalized on 11/15/12 and since then has been having pain Sx include: numbness of chest on right side; pain is constant; 7/10 on pain scale Denies: f/v/n/d Took aspirin 2 hours ago for discomfort w/little relief  He is alert and oriented w/no signs of acute distress.

## 2012-11-30 ENCOUNTER — Other Ambulatory Visit (HOSPITAL_COMMUNITY): Payer: Self-pay | Attending: Psychiatry

## 2012-11-30 ENCOUNTER — Other Ambulatory Visit (HOSPITAL_COMMUNITY): Payer: Self-pay

## 2012-11-30 ENCOUNTER — Encounter (HOSPITAL_COMMUNITY): Payer: Self-pay | Admitting: Psychology

## 2012-12-02 ENCOUNTER — Encounter (HOSPITAL_COMMUNITY): Payer: Self-pay

## 2012-12-02 ENCOUNTER — Other Ambulatory Visit (HOSPITAL_COMMUNITY): Payer: Self-pay | Attending: Psychiatry | Admitting: Psychology

## 2012-12-02 DIAGNOSIS — F1120 Opioid dependence, uncomplicated: Secondary | ICD-10-CM

## 2012-12-03 ENCOUNTER — Other Ambulatory Visit (HOSPITAL_COMMUNITY): Payer: Self-pay | Admitting: Physician Assistant

## 2012-12-03 ENCOUNTER — Other Ambulatory Visit (HOSPITAL_COMMUNITY): Payer: Self-pay

## 2012-12-04 ENCOUNTER — Encounter (HOSPITAL_COMMUNITY): Payer: Self-pay

## 2012-12-04 ENCOUNTER — Other Ambulatory Visit (HOSPITAL_COMMUNITY): Payer: Self-pay | Attending: Psychiatry | Admitting: Psychology

## 2012-12-04 DIAGNOSIS — F1920 Other psychoactive substance dependence, uncomplicated: Secondary | ICD-10-CM

## 2012-12-04 DIAGNOSIS — F1120 Opioid dependence, uncomplicated: Secondary | ICD-10-CM | POA: Insufficient documentation

## 2012-12-04 MED ORDER — IBUPROFEN 600 MG PO TABS: 600.0000 mg | ORAL_TABLET | Freq: Four times a day (QID) | ORAL | Status: DC | PRN

## 2012-12-06 ENCOUNTER — Encounter (HOSPITAL_COMMUNITY): Payer: Self-pay | Admitting: Psychology

## 2012-12-06 NOTE — Progress Notes (Unsigned)
    Daily Group Progress Note  Program: CD-IOP   Group Time: 1-2:30 pm  Participation Level: Minimal  Behavioral Response: Sharing  Type of Therapy: Process Group  Topic: Group Process: First part of group was spent in process. Members shared about progress or problems in early recovery. There was a new group member present and she introduced herself and shared about her alcohol and drug use and seeking treatment to address her problems. Every group member had attended at least one 12-step meeting since we last met.  Group Time: 2:45- 4pm  Participation Level: Active  Behavioral Response: Sharing  Type of Therapy: Psycho-education Group  Topic: Resentments: Identifying and letting go of resentments; How they hurt you in recovery. Second half of group involved a psycho-ed piece on "Resentments". the presentation included how resentments develop and the dangers they represent to those in early recovery. Members were challenged to think about how they benefit from holding onto resentments along with the dangers or damage they cause those who hold on to them. While some members recognized the negative problems that can develop from resentments, other members remained stuck in the mentality that to let go of resentments is to forgive the other person. This session brought up additional issues that will be addressed in future sessions. There was good disclosure and feedback among the group.   Summary: The patient reported he had attended a meeting at [facility] since we last met. He shared that he had received a bill in the mail for $45,000. This was for his time in ICU after he was found overdosed on heroin and having suffered a heart attack. He reported he continues to feel 'awful' after going through everything that has happened within the last 10 days. The patient had little to offer the group and displayed poor insight. He is a young, has been using drugs for much of his life and jokes a lot  during the group session. He has very serious legal issues that may result in prison. It remains to be seen whether he is committed and truly serious about changing his life or just here to look better for the legal system.    Family Program: Family present? No   Name of family member(s):   UDS collected: No Results:   AA/NA attended?: [place]  Sponsor?: No   Bertrum Helmstetter, LCAS

## 2012-12-07 ENCOUNTER — Encounter (HOSPITAL_COMMUNITY): Payer: Self-pay

## 2012-12-07 ENCOUNTER — Other Ambulatory Visit (HOSPITAL_COMMUNITY): Payer: Self-pay | Attending: Psychiatry | Admitting: Psychology

## 2012-12-07 ENCOUNTER — Encounter (HOSPITAL_COMMUNITY): Payer: Self-pay | Admitting: Psychology

## 2012-12-07 DIAGNOSIS — F1120 Opioid dependence, uncomplicated: Secondary | ICD-10-CM | POA: Insufficient documentation

## 2012-12-07 NOTE — Progress Notes (Signed)
    Daily Group Progress Note  Program: CD-IOP   Group Time: 1-2:30 pm  Participation Level: Active  Behavioral Response: Sharing  Type of Therapy: Process Group  Topic: Group Process: first part of group was spent in process. Members shared about their current struggles in early recovery. There was a new member in group and she introduced herself briefly. There was good disclosure among members. As the session continued, the medical director met with all new group members.   Group Time: 2:45 -4pm  Participation Level: Active  Behavioral Response: Rationalizing and Minimizing  Type of Therapy: Psycho-education Group  Topic: "The Recovery Pie": The second part of group was spent in a presentation on The Recovery Pie, which includes 8 basic elements in recovery. As I drew a pie with 8 segments or pieces on the board, group members identified the different parts. The importance of each one was emphasized and new members were instructed to begin to incorporate these elements into their daily lives. The importance of building a strong routine was reiterated and there was good insight shared by members who have entered these elements into their lives and the benefits of this consistent structure. The elements of this Recovery Pie include: 12-step meetings, working with a sponsor and talking with new friends in recovery, spirituality, exercise, diet, sleep, recreation, and [place].   Summary: The patient participated in group discussion about ways to stay sober. He told the group about his cravings and struggles with them. He continues to work on his cravings and how to overcome them. He reported that he has attended meetings, but has little specifics to offer when questioned. This patient has been using herion for many years and it is unclear why he is here? He has serious legal issues pending that may return him to prison and he knows he may benefit from being in treatment. The patient is a  scholarship recipient. I asked him to begin attending meetings that are new to him since the ones he has attended in the past have clearly not been very effective. We will continue to follow closely. His sobriety date remains: 2/23.   Family Program: Family present? No   Name of family member(s):   UDS collected: No Results: I gave the patient a UA and requested a sample bottle, but found it put back into the bag and it had not been used. We will ask the patient about this.    AA/NA attended?: Yes,Thursday  Sponsor?: No   Nghia Mcentee, LCAS

## 2012-12-08 ENCOUNTER — Encounter (HOSPITAL_COMMUNITY): Payer: Self-pay | Admitting: Psychology

## 2012-12-08 ENCOUNTER — Other Ambulatory Visit (HOSPITAL_COMMUNITY): Payer: Self-pay

## 2012-12-08 LAB — PRESCRIPTION ABUSE MONITORING 17P, URINE
Amphetamine/Meth: NEGATIVE ng/mL
Cannabinoid Scrn, Ur: NEGATIVE ng/mL
Carisoprodol, Urine: NEGATIVE ng/mL
Cocaine Metabolites: NEGATIVE ng/mL
Creatinine, Urine: 392.39 mg/dL (ref >20.0)
MDMA URINE: NEGATIVE ng/mL
Methadone Screen, Urine: NEGATIVE ng/mL
Oxycodone Screen, Ur: NEGATIVE ng/mL
Tapentadol, urine: NEGATIVE ng/mL
Tramadol Scrn, Ur: NEGATIVE ng/mL
Zolpidem, Urine: NEGATIVE ng/mL

## 2012-12-08 NOTE — Progress Notes (Signed)
    Daily Group Progress Note  Program: CD-IOP   Group Time: 1-2:30 pm  Participation Level: Active  Behavioral Response: Sharing and Grandiose  Type of Therapy: Process Group  Topic: Group Process: first part of group was spent in process. Members shared about the past weekend and things that they had done to support their recovery. One member did not attend any meetings over the weekend and I challenged her on her resistance to treatment. She agreed that she would attend a meeting tonight.   Group Time: 2:45- 4pm  Participation Level: Active  Behavioral Response: Sharing and Grandiose  Type of Therapy: Activity Group  Topic: "The Wheel of Life". An activity was presented after the break. Members were provided a handout and asked to chart where they stand in each of the 8 categories identified. One by one they came up to the board and charted where they were. Each member described how they had rated their happiness or success in each area and, when lacking, they were provided good feedback on how they might address and improve their standing. This activity provided a deeper and more insightful understanding of each of the group members.   Summary: The patient reported he had attended 2 meetings over the weekend. He still hasn't secured a sponsor and I encouraged the importance of him finding someone to guide him in early recovery. He continues to attend meetings, but it is clear that he has never really engaged in the Fellowship, but only showed up to 'warm a seat'. He was very talkative during this session and seemed determined to be the 'group clown'. He was somewhat defensive when I challenged his excuses for not attending more 12-step meetings since he isn't supposedly working or have any daily commitments. The patient is currently living with his mother and has not ride, but I pointed out that he can surely get rides to AA/NA meetings if he just makes it known to others attending the  meetings. I also noted that a number of group members pass by his home on the way to and from the group sessions here at [facility]. The patient has used for many years and it remains to be seen whether he is here because he wants to be or because he has major legal issues and this sure looks good to the court system. We will continue to follow closely.    Family Program: Family present? No   Name of family member(s):   UDS collected: Yes Results: not back  AA/NA attended?: YesFriday and Saturday  Sponsor?: No   Karla Pavone, LCAS

## 2012-12-09 ENCOUNTER — Other Ambulatory Visit (HOSPITAL_COMMUNITY): Payer: Self-pay | Attending: Psychiatry

## 2012-12-09 ENCOUNTER — Encounter (HOSPITAL_COMMUNITY): Payer: Self-pay

## 2012-12-09 ENCOUNTER — Other Ambulatory Visit (HOSPITAL_COMMUNITY): Payer: Self-pay | Admitting: Psychology

## 2012-12-09 DIAGNOSIS — F1920 Other psychoactive substance dependence, uncomplicated: Secondary | ICD-10-CM | POA: Insufficient documentation

## 2012-12-09 LAB — OPIATES/OPIOIDS (LC/MS-MS)
Codeine Urine: NEGATIVE ng/mL
Heroin (6-AM), UR: NEGATIVE ng/mL
Hydrocodone: NEGATIVE ng/mL
Morphine Urine: 59 ng/mL
Noroxycodone, Ur: NEGATIVE ng/mL

## 2012-12-10 ENCOUNTER — Other Ambulatory Visit (HOSPITAL_COMMUNITY): Payer: Self-pay

## 2012-12-10 LAB — PRESCRIPTION ABUSE MONITORING 17P, URINE
Barbiturate Screen, Urine: NEGATIVE ng/mL
Benzodiazepine Screen, Urine: NEGATIVE ng/mL
Buprenorphine, Urine: NEGATIVE ng/mL
Creatinine, Urine: 384.91 mg/dL (ref >20.0)
Fentanyl, Ur: NEGATIVE ng/mL
MDMA URINE: NEGATIVE ng/mL
Methadone Screen, Urine: NEGATIVE ng/mL
Oxycodone Screen, Ur: NEGATIVE ng/mL
Propoxyphene: NEGATIVE ng/mL
Zolpidem, Urine: NEGATIVE ng/mL

## 2012-12-10 LAB — ALCOHOL METABOLITE (ETG), URINE: Ethyl Glucuronide (EtG): NEGATIVE ng/mL

## 2012-12-11 ENCOUNTER — Encounter (HOSPITAL_COMMUNITY): Payer: Self-pay | Admitting: Psychology

## 2012-12-11 ENCOUNTER — Encounter (HOSPITAL_COMMUNITY): Payer: Self-pay

## 2012-12-11 ENCOUNTER — Other Ambulatory Visit (HOSPITAL_COMMUNITY): Payer: Self-pay

## 2012-12-11 ENCOUNTER — Other Ambulatory Visit (HOSPITAL_COMMUNITY): Payer: Self-pay | Admitting: Psychology

## 2012-12-11 DIAGNOSIS — F1120 Opioid dependence, uncomplicated: Secondary | ICD-10-CM

## 2012-12-11 NOTE — Progress Notes (Signed)
    Daily Group Progress Note  Program: CD-IOP   Group Time: 1-2:30 pm  Participation Level: Active  Behavioral Response: Sharing and Grandiose  Type of Therapy: Psycho-education Group  Topic:  Pharmacist: First part of group was spent with a visit from the pharmacist. The pharmacist reviewed the different categories of drugs and described the various types of medications that are prescribed to address specific illnesses. There was a good exchange between members and the guest speaker and the session proved very informative.   Group Time: 2:45- 4pm  Participation Level: Active  Behavioral Response: Sharing, Resistant and Attention-Seeking  Type of Therapy: Group Process  Topic: Group Process/Graduation: the second half of group was spent in process. Members shared about current struggles and issues in recovery. A new group member was present today and he disclosed the events that have brought him to treatment. The group provided good feedback and the patient responded favorably. Near the end of the session, a graduation ceremony was held for one of the group members graduating successfully from the program today. Kind words and hopes were shared and the member received a warm farewell from his fellow group members.   Summary: The patient was attentive and shared about his opiate use in response to the comments from the pharmacist about medications addressing opioid cravings, including naltrexone and vivitrol. He laughed and seemed to romance his drug use and its effects throughout the session. He almost acted as if he was 'high' and a drug test was collected. The patient was very resistant to me as I questioned him about attending 12-step meetings. His court date scheduled for yesterday had been cancelled, but he didn't attend any meetings yesterday. When I challenged him about his lack of commitment, he reported that he was doing the best he could and he wouldn't commit or consider other  options. The patient continued to laugh and joke throughout the session and shared some 'funny' words with the graduating member. It appears that Mataio feels some closeness to the graduating member because they both used heroin, but that feeling does not seem to be returned by the graduating member. This patient is attended very few meetings and has never reached out or worked with a sponsor in the past. He almost died from an overdose last month and did have a heart attack sometime during the overdose. Despite this near death experience, he displays little intention and poor motivation to make significant changes in his daily life.    Family Program: Family present? No   Name of family member(s):   UDS collected: Yes Results: negative  AA/NA attended?: YesMonday  Sponsor?: No   Inaara Tye, LCAS

## 2012-12-13 ENCOUNTER — Encounter (HOSPITAL_COMMUNITY): Payer: Self-pay | Admitting: Psychology
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
## 2012-12-14 ENCOUNTER — Other Ambulatory Visit (HOSPITAL_COMMUNITY): Payer: Self-pay | Attending: Psychiatry | Admitting: Psychology

## 2012-12-14 ENCOUNTER — Encounter (HOSPITAL_COMMUNITY): Payer: Self-pay

## 2012-12-14 DIAGNOSIS — F1120 Opioid dependence, uncomplicated: Secondary | ICD-10-CM | POA: Insufficient documentation
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
## 2012-12-15 ENCOUNTER — Encounter (HOSPITAL_COMMUNITY): Payer: Self-pay | Admitting: Psychology

## 2012-12-15 ENCOUNTER — Other Ambulatory Visit (HOSPITAL_COMMUNITY): Payer: Self-pay

## 2012-12-15 NOTE — Progress Notes (Signed)
    Daily Group Progress Note  Program: CD-IOP   Group Time: 1-2:45- pm  Participation Level: Active  Behavioral Response: Sharing, Attention-Seeking, The Group 'Clown'  Type of Therapy: Process Group  Topic: Process: First part of group was Group spent in process. Members shared about the past weekend and the things they did to support their recovery. There was good sharing and feedback among group members.   Group Time: 3-4 pm  Participation Level: Active  Behavioral Response: Sharing  Type of Therapy: Activity Group  Topic: "Family Sculpture": The second half of group was spent in an activity known as Contractor. Members were invited to 'sculpt' their families using their fellow group members. It is an intense activity that provides more insight into the member's family dynamics and provides a different perspective to the sculpting member. The activity proved very informative for everyone and in more than one instance, group members gained a new perspective on their own family through the sculpting of other families.   Summary: the patient arrived on time for group today. We had anticipated him being late because he had a court date. The patient explained that his assigned Air cabin crew" and decided he didn't want to represent him. Austin Nielsen reported he contacted his other attorney, Austin Nielsen, who assured him he would get him well-represented. The patient reported he had attended a meeting today and went to church on Sunday. He reported he recognized he has to let go and stop trying to do it his way and he is willing to listen to others. He reported he had also visited the memorial garden where a tree was planted for his sister, Austin Nielsen, shortly after she died. He agreed to sculpt his family in the second part of group and seemed to benefit from talking about it. He also gained insight into some of his family dynamics by listening to others explain their childhoods and  the roles they and siblings might have played. The patient remains with his 2/23 sobriety date. One role he represents in the group is the 'group clown'. He tries to be funny at times that are not appropriate and insists that despite his pain and abusive childhood, "it's all good". He will be reminded not to gloss over traumatic times or deny the reality of his life. This 27 yo has 7 pending felonies, had a heart attack when he overdosed in February, has lived the addictive lifestyle for over half of his life, and will have to make incredible changes if his life is going to change for the better. At this point, it is realistic to admit, "it isn't all good".    Family Program: Family present? No   Name of family member(s):   UDS collected: No Results:  AA/NA attended?: [place] and Saturday  Sponsor?: No and he has been instructed to secure a sponsor   Austin Nielsen, LCAS

## 2012-12-16 ENCOUNTER — Encounter (HOSPITAL_COMMUNITY): Payer: Self-pay

## 2012-12-16 ENCOUNTER — Other Ambulatory Visit (HOSPITAL_COMMUNITY): Payer: Self-pay

## 2012-12-16 ENCOUNTER — Other Ambulatory Visit (HOSPITAL_COMMUNITY): Payer: Self-pay | Attending: Psychiatry | Admitting: Psychology

## 2012-12-16 DIAGNOSIS — F1120 Opioid dependence, uncomplicated: Secondary | ICD-10-CM

## 2012-12-17 ENCOUNTER — Encounter (HOSPITAL_COMMUNITY): Payer: Self-pay | Admitting: Psychology

## 2012-12-17 ENCOUNTER — Other Ambulatory Visit (HOSPITAL_COMMUNITY): Payer: Self-pay

## 2012-12-17 LAB — PRESCRIPTION ABUSE MONITORING 17P, URINE
Amphetamine/Meth: NEGATIVE ng/mL
Barbiturate Screen, Urine: NEGATIVE ng/mL
Benzodiazepine Screen, Urine: NEGATIVE ng/mL
Carisoprodol, Urine: NEGATIVE ng/mL
Fentanyl, Ur: NEGATIVE ng/mL
Meperidine, Ur: NEGATIVE ng/mL
Oxycodone Screen, Ur: NEGATIVE ng/mL
Propoxyphene: NEGATIVE ng/mL

## 2012-12-17 NOTE — Progress Notes (Signed)
    Daily Group Progress Note  Program: CD-IOP   Group Time: 1-2:30 pm  Participation Level: Active  Behavioral Response: Appropriate and Sharing  Type of Therapy: Process Group  Topic: Group Process: first part of group was spent in process. Members shared about the past two days since the last session and current issues and concerns they are dealing with in early recovery. One member reported she had realized a lot of things after witnessing the sculptures that other members had completed. She is now recognizing how much pain and hurt she has 'stuffed' in years past. This disclosure allowed me to reiterate the importance of digging up and sharing the feelings members have discounted, tried to ignore, or stuffed in their past. Those will remain problematic until they are addressed. The session proved very revealing and members responded positively.  Group Time: 2:45- 4pm  Participation Level: Active  Behavioral Response: Sharing  Type of Therapy: Psycho-education Group  Topic: Emotional Buttons: second half of group was spent in a psycho-ed session. A handout was provided and members took turns reading the different types of 'Emotional Buttons' that people have. Members provided feedback and examples of each one. Some differed about the characteristics provided while others agreed that these descriptions fit them quite accurately. The importance of recognizing what one's buttons are was emphasized and members encouraged to pay more attention to the underlying issues that seem to generate the most powerful responses.    Summary: The patient reported he had attended 2 AA meetings since the last group session. He had attended the noon meeting today at the [employer] and gotten a ride to group with another group member who was there. He noted that 3 group members were in the meeting and is seemed quite clear that the familiarity or comraderie was important to him. The patient reported he  had realized that he needs to do things and share feelings that are uncomfortable to him if he is going to change. He pointed out that by being uncomfortable, it indicates that he is doing something different. He received good feedback for this realization. He made numerous comments in the session on emotional buttons and indicated the Rejection and Rescue buttons were big for him. The patient is displaying a surprising surprising degree of motivation and is making comments that suggest he may be honest about changing his life. His sobriety date remains 2/23.   Family Program: Family present? No   Name of family member(s):   UDS collected: Yes Results: Negative  AA/NA attended?: YesTuesday and Wednesday  Sponsor?: No   Shoichi Mielke, LCAS

## 2012-12-18 ENCOUNTER — Other Ambulatory Visit (HOSPITAL_COMMUNITY): Payer: Self-pay

## 2012-12-18 ENCOUNTER — Encounter (HOSPITAL_COMMUNITY): Payer: Self-pay

## 2012-12-18 ENCOUNTER — Other Ambulatory Visit (HOSPITAL_COMMUNITY): Payer: Self-pay | Admitting: Psychology

## 2012-12-18 DIAGNOSIS — F1120 Opioid dependence, uncomplicated: Secondary | ICD-10-CM

## 2012-12-18 LAB — OPIATES/OPIOIDS (LC/MS-MS)
Hydrocodone: NEGATIVE ng/mL
Hydromorphone: NEGATIVE ng/mL
Morphine Urine: 71 ng/mL
Norhydrocodone, Ur: NEGATIVE ng/mL
Oxymorphone: NEGATIVE ng/mL

## 2012-12-21 ENCOUNTER — Other Ambulatory Visit (HOSPITAL_COMMUNITY): Payer: Self-pay

## 2012-12-21 ENCOUNTER — Other Ambulatory Visit (HOSPITAL_COMMUNITY): Payer: Self-pay | Admitting: Psychology

## 2012-12-21 ENCOUNTER — Encounter (HOSPITAL_COMMUNITY): Payer: Self-pay

## 2012-12-21 ENCOUNTER — Encounter (HOSPITAL_COMMUNITY): Payer: Self-pay | Admitting: Psychology

## 2012-12-21 DIAGNOSIS — F1120 Opioid dependence, uncomplicated: Secondary | ICD-10-CM

## 2012-12-21 DIAGNOSIS — F1920 Other psychoactive substance dependence, uncomplicated: Secondary | ICD-10-CM

## 2012-12-21 NOTE — Progress Notes (Signed)
    Daily Group Progress Note  Program: CD-IOP   Group Time: 1-2:30 pm  Participation Level: Active  Behavioral Response: Sharing  Type of Therapy: Process Group  Topic: Group process; First part of group was spent in process. Members shared about current issues and concerns they are dealing with in early recovery. There were a variety of concerns expressed and good feedback and support provided by group members.  Group Time: 2:45- 4pm  Participation Level: Active  Behavioral Response: Sharing, resistant  Type of Therapy: Psycho-education Group  Topic: Stress Management: a psycho-ed piece was provided on Stress Management. Members were asked to identify where they hold their stress. One member noted that he is becoming more aware of what he is feelings in his body and is more quickly able to address the stress he feels in his body. One member couldn't really confirm that he feels stress. Most members were able to identify where they hold their stress, but aren't very aware of it until way after the fact. A guided relaxation exercise was provided and members closed their eyes and followed the lead. Members responded well to this intervention and asked that they continue to receive more training in this area.   Summary: The patient reported he had attended a church service on Thursday and felt very supported by the other worshippers. When asked about his legal charges, the patient reported he had spoken with his attorney, Nickolai Rinks, and 2 of his pending 13 charges had been dropped. He insisted he wasn't nervous or worried about what is going to happen and reminded the group that he had been in prison before and whatever happened, he just wanted to get it over with. The patient did not feel as if the relaxation exercise was helpful and he really couldn't identify if he ever really felt stressed out. This patient is likely very disconnected with his feelings or misinterprets what he is  feeling. We will explore this more in our individual sessions because it seems likely that he has and continues to experience stress, but just doesn't know it. The patient reported he remains drug-free and has since he overdosed in February. His sobriety date is 2/23.   Family Program: Family present? No   Name of family member(s):   UDS collected: No Results:   AA/NA attended?: YesThursday and Friday  Sponsor?: No   Xiana Carns, LCAS

## 2012-12-22 ENCOUNTER — Other Ambulatory Visit (HOSPITAL_COMMUNITY): Payer: Self-pay

## 2012-12-22 ENCOUNTER — Encounter (HOSPITAL_COMMUNITY): Payer: Self-pay | Admitting: Psychology

## 2012-12-22 NOTE — Progress Notes (Signed)
Daily Group Progress Note  Program: CD-IOP   Group Time: 1-2:30 pm  Participation Level: Active  Behavioral Response: Rigid, Resistant and Minimizing  Type of Therapy: Process Group  Topic: Group Process: First part of group was spent in process. Group members checked in and shared events from the past weekend.  One member admitted that she had relapsed. The events of the evening were reviewed and discussed. The member shared what had occurred and the group described other options that she could have taken. The member who relapsed admitted she had finally accepted that she was an alcoholic and couldn't drink like others. The group provided this young woman with good support and validation for her honesty was acknowledged and the slip was viewed as a learning process for everyone present.  Group Time: 2:45-4pm  Participation Level: Active  Behavioral Response: Sharing, Grandiose and Attention-Seeking  Type of Therapy: Psycho-education Group  Topic: "The Development Process of Recovery"/ Guest Speaker: Second half of group was spent in in a psycho-ed presentation. A handout was provided that identified the process from early recovery up until 18 months. Members shared about the feelings they have experienced in this process. Of course, no one in the group has even 3 months, but plenty identified with the pink cloud and then the sudden return to reality. A guest speaker appeared for the last 45 minutes and shared his story since he first entered treatment for his alcohol dependence. He talked bout the actions he had taken in early recovery, the mistakes he made, and the choices that landed him in ICU in restraints for alcohol detox for the second time. The group was very appreciate and asked good questions of this young man who had recently picked up his 1 year chip.   Summary: The patient reported he remained drug-free with a sobriety date of 2/23. He stated he had spent most of the  weekend at home. He agreed with another member and noted that he would have had a difficult time staying clean if he had done what the member did that relapsed. On that note, though, I challenged him and asked what he was doing differently this time? He had little to offer and was very resistant to my questions. He admitted he had gone out with friends who were using and they had spent a few hours at a strip club. He wanted to 'test himself', which the group agreed had been pretty wreckless. This patient seemed to think he had done quite well. When he admitted not having attended any meetings over the weekend, I reminded him that he had agreed to go to at least 4 meetings weekly, but wondered why he didn't go to more? Certainly he recognized he had to make some big changes if he is going to transform himself? The patient was very resistant and defensive about this question and was challenged my leadership in the group. He was attentive to the guest speaker and admitted that he had not gone the extra steps that he should if he is going to remain drug-free going forward. 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 It seems very unlikely that he will make the needed changes nor if he really even wants to.    Family Program: Family present? No   Name of family member(s):   UDS collected: NO Results: but 2 of the last 3  are positive for Morphine - he denies using  AA/NA attended?: YesMonday  Sponsor?: No   Makelle Marrone, LCAS

## 2012-12-23 ENCOUNTER — Other Ambulatory Visit (HOSPITAL_COMMUNITY): Payer: Self-pay | Attending: Psychiatry | Admitting: Psychology

## 2012-12-23 ENCOUNTER — Encounter (HOSPITAL_COMMUNITY): Payer: Self-pay

## 2012-12-23 ENCOUNTER — Other Ambulatory Visit (HOSPITAL_COMMUNITY): Payer: Self-pay

## 2012-12-23 DIAGNOSIS — F1120 Opioid dependence, uncomplicated: Secondary | ICD-10-CM

## 2012-12-23 DIAGNOSIS — F1920 Other psychoactive substance dependence, uncomplicated: Secondary | ICD-10-CM | POA: Insufficient documentation

## 2012-12-23 LAB — PRESCRIPTION ABUSE MONITORING 17P, URINE
Benzodiazepine Screen, Urine: NEGATIVE ng/mL
Buprenorphine, Urine: NEGATIVE ng/mL
Cannabinoid Scrn, Ur: NEGATIVE ng/mL
Cocaine Metabolites: NEGATIVE ng/mL
Creatinine, Urine: 191.03 mg/dL (ref >20.0)
MDMA URINE: NEGATIVE ng/mL
Meperidine, Ur: NEGATIVE ng/mL
Methadone Screen, Urine: NEGATIVE ng/mL
Tapentadol, urine: NEGATIVE ng/mL
Tramadol Scrn, Ur: NEGATIVE ng/mL
Zolpidem, Urine: NEGATIVE ng/mL

## 2012-12-24 ENCOUNTER — Other Ambulatory Visit (HOSPITAL_COMMUNITY): Payer: Self-pay

## 2012-12-25 ENCOUNTER — Other Ambulatory Visit (HOSPITAL_COMMUNITY): Payer: Self-pay

## 2012-12-25 ENCOUNTER — Encounter (HOSPITAL_COMMUNITY): Payer: Self-pay

## 2012-12-25 ENCOUNTER — Other Ambulatory Visit (HOSPITAL_COMMUNITY): Payer: Self-pay | Admitting: Psychology

## 2012-12-25 DIAGNOSIS — F1120 Opioid dependence, uncomplicated: Secondary | ICD-10-CM

## 2012-12-26 ENCOUNTER — Encounter (HOSPITAL_COMMUNITY): Payer: Self-pay | Admitting: Psychology

## 2012-12-26 NOTE — Progress Notes (Signed)
    Daily Group Progress Note  Program: CD-IOP   Group Time: 1-2:45 pm  Participation Level: Active  Behavioral Response: Sharing  Type of Therapy: Process Group  Topic: Group Process: first part of group was spent in process. Members shared about current issues that they are dealing with in early recovery. A new member was present and she took the opportunity to share a little about her life. Members had a number of different issues and they talked about what they are doing to address the stress they are feeling and dealing with thoughts about using.  Group Time: 3-4 PM  Participation Level: Active  Behavioral Response: Appropriate and Sharing  Type of Therapy: Group Therapy  Topic: Denial and, Finally, Admitting a Relapse: the second half of group was spent discussing the circumstances and time fame of a relapse. A member had been given the results of her drug test from last Wednesday. Despite being positive for alcohol with a UA collected on 3/26, she repeated her sobriety date was 2/19. I explained that it was okay if she had slipped, but it was best to be honest. After denying this test result, the patient finally admitted she had drank. The group was very patient and gentle with her and her obvious emotional shame around having lied to them. The group listened and shared as she recounted the events up to her relapse, including purchasing the 6-pack, drinking it, and getting sick. The patient admitted she felt relieved having finally admitted what she had done.   Summary: The patient reported he had finally secured a new sponsor. He seemed pleased and ready to work with this new 'mentor'. He reported having attended a meeting and gone to church since the last session. He expressed concern about his mother. She had fallen and seemed disoriented for a time. The patient was very present as his 'friend' disclosed and finally admitted that she had drank on the night of the 25th. He  discouraged her from feeling embarrassd or ashamed and reminded her that most people relapse and he has many times.  The patient provided good support to his fellow group member and was very validating to her. He continues to identify his sobriety date as 2/24, but he has had positive UA's only to deny them. We will continue to follow closely in the days ahead.   Family Program: Family present? No   Name of family member(s):   UDS collected: No Results:   AA/NA attended?: YesThursday  Sponsor?: Yes, patient reported he has secured a new sponsor   Marvens Hollars, LCAS

## 2012-12-28 ENCOUNTER — Encounter (HOSPITAL_COMMUNITY): Payer: Self-pay

## 2012-12-28 ENCOUNTER — Other Ambulatory Visit (HOSPITAL_COMMUNITY): Payer: Self-pay

## 2012-12-28 ENCOUNTER — Encounter (HOSPITAL_COMMUNITY): Payer: Self-pay | Admitting: Psychology

## 2012-12-28 NOTE — Progress Notes (Signed)
    Daily Group Progress Note  Program: CD-IOP   Group Time: 1-2:30 pm  Participation Level: Active  Behavioral Response: Sharing  Type of Therapy: Process Group  Topic: Group Process: the first part of group was spent in process. Members shared about their issues and concerns in early recovery. A new group member was present and she introduced herself and talked about her alcoholism and how it has brought her to this program. There was good disclosure and sharing among the group.   Group Time: 2:45- 4pm  Participation Level: Active  Behavioral Response: Sharing, Grandiose and Attention-Seeking  Type of Therapy: Psycho-education Group  Topic: Stress Management: a psycho-ed presentation was made on Stress Management. A handout was provided and an explanation of the Stress Response (Fight or Flight) mapped out on the board. The ongoing or chronic stress that many of [place] experience in today's fast paced world was examined. The problems associated with an elevated or more constant stress response detailed. Many of the group members admitted they suffer some of these symptoms. The group took turns reading from the handout and the importance of daily practices that promote balance and good health were emphasized.   Summary: The patient reported he had attended the NA meeting yesterday afternoon, [place]. He admitted he had gone to work yesterday afternoon, but as soon as he saw the [employer]iece, let alone got in it, he began to experience cravings. I wondered about him driving and he admitted he couldn't drive - "legally". The patient explained that he had had 2-DUI's, the most recent on January 6th, and he should get his license back in about 9 months. He admitted he had lied during the assessment and they had given him the minimum number of hours. The patient reported after a short time at work he had to leave. He admitted that if he had stayed he was going to use. The patient's disclosure demonstrated  the powerful cues that exist with 'people, places, and things and how strongly places where one has used remains in the memory of the limbic system. When we discussed stress, the patient admitted that he never gets stressed out and didn't even know what it felt like. His recognition of the cues from his work place and his heroin use was beneficial to the group, but the patient continues to minimize the severeity of his addiction and is unwilling to try our recommendations about different meetings. He continues to deny any drug use despite 2 morphine positive drug tests and he insists his sobriety date is 2/23.    Family Program: Family present? No   Name of family member(s):   UDS collected: No Results:   AA/NA attended?: YesTuesday  Sponsor?: Yes   Amaranta Mehl, LCAS

## 2012-12-29 ENCOUNTER — Telehealth (HOSPITAL_COMMUNITY): Payer: Self-pay | Admitting: Psychology

## 2012-12-29 ENCOUNTER — Other Ambulatory Visit (HOSPITAL_COMMUNITY): Payer: Self-pay

## 2012-12-30 ENCOUNTER — Encounter (HOSPITAL_COMMUNITY): Payer: Self-pay

## 2012-12-30 ENCOUNTER — Other Ambulatory Visit (HOSPITAL_COMMUNITY): Payer: Self-pay

## 2012-12-31 ENCOUNTER — Other Ambulatory Visit (HOSPITAL_COMMUNITY): Payer: Self-pay

## 2013-01-01 ENCOUNTER — Encounter (HOSPITAL_COMMUNITY): Payer: Self-pay

## 2013-01-01 ENCOUNTER — Other Ambulatory Visit (HOSPITAL_COMMUNITY): Payer: Self-pay

## 2013-01-01 ENCOUNTER — Encounter (HOSPITAL_COMMUNITY): Payer: Self-pay | Admitting: Psychology

## 2013-01-01 ENCOUNTER — Other Ambulatory Visit (HOSPITAL_COMMUNITY): Payer: Self-pay | Admitting: Psychology

## 2013-01-01 DIAGNOSIS — F1120 Opioid dependence, uncomplicated: Secondary | ICD-10-CM

## 2013-01-01 DIAGNOSIS — F1920 Other psychoactive substance dependence, uncomplicated: Secondary | ICD-10-CM

## 2013-01-02 LAB — PRESCRIPTION ABUSE MONITORING 17P, URINE
Amphetamine/Meth: NEGATIVE ng/mL
Barbiturate Screen, Urine: NEGATIVE ng/mL
Cannabinoid Scrn, Ur: NEGATIVE ng/mL
Carisoprodol, Urine: NEGATIVE ng/mL
Cocaine Metabolites: NEGATIVE ng/mL
Fentanyl, Ur: NEGATIVE ng/mL
MDMA URINE: NEGATIVE ng/mL
Meperidine, Ur: NEGATIVE ng/mL
Oxycodone Screen, Ur: NEGATIVE ng/mL
Tapentadol, urine: NEGATIVE ng/mL
Tramadol Scrn, Ur: NEGATIVE ng/mL
Zolpidem, Urine: NEGATIVE ng/mL

## 2013-01-04 ENCOUNTER — Encounter (HOSPITAL_COMMUNITY): Payer: Self-pay | Admitting: Psychology

## 2013-01-04 ENCOUNTER — Other Ambulatory Visit (HOSPITAL_COMMUNITY): Payer: Self-pay | Admitting: Psychology

## 2013-01-04 ENCOUNTER — Other Ambulatory Visit (HOSPITAL_COMMUNITY): Payer: Self-pay

## 2013-01-04 ENCOUNTER — Encounter (HOSPITAL_COMMUNITY): Payer: Self-pay

## 2013-01-04 DIAGNOSIS — F1920 Other psychoactive substance dependence, uncomplicated: Secondary | ICD-10-CM

## 2013-01-04 DIAGNOSIS — F1120 Opioid dependence, uncomplicated: Secondary | ICD-10-CM

## 2013-01-04 NOTE — Progress Notes (Signed)
    Daily Group Progress Note  Program: CD-IOP   Group Time: 1-2:30 pm  Participation Level: Active  Behavioral Response: Sharing  Type of Therapy: Process Group  Topic: Group Process: first part of group was spent in process. Members discussed the current issues and concerns they are facing. They shared plans for the upcoming weekend and, specifically, any things that would support their sobriety. One member admitted he had lied to the group and had used twice since he had overdosed in February. Another member returned after a trip out of state to visit a close relative who is very ill. There was good discussion and feedback.   Group Time:2:45- 4pm  Participation Level: Active  Behavioral Response: Sharing  Type of Therapy: Psycho-education Group  Topic: Recovery: the difference between treatment and recovery. Second part of group was spent in a psycho-ed session on the differences between treatment and recovery. The aspects of recovery that differ from treatment were emphasized. Thos include: a way of life, practicing honesty, and the life-long nature of the disease. The four essential elements of recovery include: acceptance, honesty, open-mindedness, and willingness. A handout was included and members shared feedback and their experiences in early recovery. During this session, the medical director met with 2 new members along with 2 other members who had questions about their medications.   Summary: The patient checked-in with the group and admitted that he had lied. He reported he had felt terrible about it, especially when another member had been confronted and had admitted she had lied. The patient agreed with her that the guilty about lying to the group had been much worse than the guilt about using. He insisted he wants to be honest and make changes in his life. It remains to be seen whether he is capable or truly committed to a life of total sobriety. The patient reported he has  a court date next week and it might be the final ending of his legal charges. His new sobriety date is 3/22.  Family Program: Family present? No   Name of family member(s):   UDS collected: Yes Results: negative  AA/NA attended?: YesThursday  Sponsor?: Yes   Mikaela Hilgeman, LCAS

## 2013-01-05 ENCOUNTER — Other Ambulatory Visit (HOSPITAL_COMMUNITY): Payer: Self-pay

## 2013-01-05 ENCOUNTER — Encounter (HOSPITAL_COMMUNITY): Payer: Self-pay | Admitting: Psychology

## 2013-01-05 LAB — PRESCRIPTION ABUSE MONITORING 17P, URINE
Amphetamine/Meth: NEGATIVE ng/mL
Cannabinoid Scrn, Ur: NEGATIVE ng/mL
Carisoprodol, Urine: NEGATIVE ng/mL
Cocaine Metabolites: NEGATIVE ng/mL
Creatinine, Urine: 147.61 mg/dL (ref >20.0)
MDMA URINE: NEGATIVE ng/mL
Methadone Screen, Urine: NEGATIVE ng/mL
Opiate Screen, Urine: NEGATIVE ng/mL
Oxycodone Screen, Ur: NEGATIVE ng/mL
Tapentadol, urine: NEGATIVE ng/mL
Tramadol Scrn, Ur: NEGATIVE ng/mL
Zolpidem, Urine: NEGATIVE ng/mL

## 2013-01-05 NOTE — Progress Notes (Signed)
    Daily Group Progress Note  Program: CD-IOP   Group Time: 1-2:30 pm  Participation Level: Active  Behavioral Response: Sharing and Attention Seeking  Type of Therapy: Process Group  Topic: Group Process: The first part of group was spent in process. Members shared about the past weekend and the things they did to support their recovery and remain abstinent. A number of members shared the struggles they had experienced. One member cried and expressed her anger at having this disease. Another admitted she and her S/O were really going to have to make some changes. A new member was present and she shared how she had stayed sober over the weekend, but admitted it was really difficult. There was good disclosure among group members.   Group Time: 2:45- 4pm  Participation Level: Active  Behavioral Response: Attention-Seeking  Type of Therapy: Psycho-education Group  Topic: "Letting Go": A handout and psycho-ed presentation was made on the importance of "letting go". The emphasis was on recognizing and accepting that one cannot do for others all the time. One must let others find their way, but it doesn't in anyway mean they aren't loved. The focus of this presentation was how to be healthy, but loving to others, which means not doing for others what they can do for themselves. There was some good feedback and a number of members pointed out that they are caretakers and have spent their lives trying to make others happy. The futility of these efforts was identified and these group members were, instead, encouraged to focus on their own growth and satisfying their own needs.    Summary: The patient reported he had had a good weekend, gone to a meeting, church, and spoken with his sponsor. He continues to make jokes and seems to consider himself the 'clown' of the group. He reported his sobriety date remains 3/27. He stated that he speaks with his sponsor every day. The patient reported he and  his mother had moved into a larger home and he has more room now. He offers little of his self to the group and seems very comfortable talking about superficial things. He admitted that he never really feels any stress except when he is in court and before he hears the judge's decision.    Family Program: Family present? No   Name of family member(s):   UDS collected: Yes Results: not back yet from lab  AA/NA attended?: YesMonday  Sponsor?: Yes   Mercer Stallworth, LCAS

## 2013-01-06 ENCOUNTER — Other Ambulatory Visit (HOSPITAL_COMMUNITY): Payer: Self-pay | Admitting: Psychology

## 2013-01-06 ENCOUNTER — Encounter (HOSPITAL_COMMUNITY): Payer: Self-pay

## 2013-01-06 ENCOUNTER — Other Ambulatory Visit (HOSPITAL_COMMUNITY): Payer: Self-pay

## 2013-01-06 DIAGNOSIS — F1120 Opioid dependence, uncomplicated: Secondary | ICD-10-CM

## 2013-01-06 LAB — ETHYL GLUCURONIDE, URINE: Ethyl Glucuronide (EtG): 1211 ng/mL — ABNORMAL HIGH

## 2013-01-06 NOTE — Progress Notes (Unsigned)
Patient ID: Austin Nielsen, male   DOB: 1985-10-26, 27 y.o.   MRN: [id number] CD-IOP: Individual Therapy Session. I met with the patient before his 1 pm outpatient group session. I needed to meet with his to review his progress to date in the program, but even more importantly, to discuss with him the results of the random UA tests I have collected from [place] and his fellow group members. Despite having 2 positive UA's for morphine, the patient has continued to deny he has used anything since he overdosed and suffered a heart attack back in late February. 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 I explained this to the patient and stated that he must either admit to using and be honest with the group or leave the program. The patient is here on scholarship and sought out this program. If he can't be honest about his addiction and drug  use, there is no reason or benefits to be gained from the program. He was resistant and seemed confused, but finally, the patient admitted that he had used. He seemed unclear about when he last used, but I explained that the drug test collected on Wednesday, March 26, was positive for morphine so he must have used the day or two before that. He had previously maintained having a sobriety date of 2/27, but after admitting the relapses, he identified his sobriety date as 3/27. The medical director, AW, came to our meeting room and he explained the importance of being honest and how this patient must go about combating his long history of addiction. The patient was attentive and seemed genuinely interested in the insights and recommendations shared by AW. As the session ended, it was agreed that the patient would sign a Behavioral Contract agreeing to: remain alcohol and drug-free, attend the CD-IOP and meet with me  weekly, and attend at least 6 12-step meetings per week. The patient agreed that if he does not follow the above agreed-upon criteria, he will be discharged from the program. He will be recommended to enter a more structured level of care, possibly [facility]. The patient has court dates approaching that include 7 felony charges and incarceration is a real possibility. Relative to his treatment goals, the patient has used at least twice and his engagement in the 12-step community uncommitted at best. The patient's third treatment goal was that his legal issues be resolved with no jail time, but a sentence that included probation. We will continue to follow closely in the days ahead.

## 2013-01-07 ENCOUNTER — Encounter (HOSPITAL_COMMUNITY): Payer: Self-pay | Admitting: Psychology

## 2013-01-07 NOTE — Progress Notes (Unsigned)
    Daily Group Progress Note  Program: CD-IOP   Group Time: 1-2:30 pm  Participation Level: Active  Behavioral Response: Sharing  Type of Therapy: Process Group  Topic: Group Process: the first part of group was spent in process. The group had been cancelled 2 of the last 3 sessions due to snow. There was a lot to catch up on. Also present were 3 new group members. While 2 of them shared openly, the 3rd group member, a 27 yo male, would not speak beyond a very few words.   Group Time: 2:45-4 pm  Participation Level: Active  Behavioral Response: Sharing and Grandiose  Type of Therapy: Psycho-education Group  Topic: Communication: How to Get Your Needs Met. The second half of group was spent in a psycho-ed piece on communication. Group members were provided a handout and the 4 different styles of communication were discussed and explored at length. They include: Passive, Aggressive, Passive Aggressive, and Assertive Communication. There were differences of opinion about being honest, or assertive, and some members explained they had found being passive to be safe and not risking getting hurt or rejected by others. It seemed very clear that most of these patients are very poor communicators and there will be much more work needed in this area.   Summary: the patient was new to the group and introduced himself. He reported he had overdosed in February and suffered a heart attack. He is only 27 yo. The patient reported he is in a lot of pain and discomfort and had just gotten out of the hospital recently. He reported his mother was in town and had come back to care for him. He reported he didn't want to use any more and would not because he didn't want to hurt his mother. I pointed out that "not wanting to hurt someone else" is not sufficient grounds to remain drug-free, but the patient insisted he was not going to use again. He also shared about serious legal issues that are pending. He has  been in prison twice in the past and admitted he didn't have a problem going to prison again if that is what the court decides. The group seemed captivated by this new member, but he has lived the addictive lifestyle for many years and changing will not be easy. We will see what he intends to do to demonstrate his newfound commitment to abstinence.    Family Program: Family present? No   Name of family member(s):   UDS collected: No Results:   AA/NA attended?: YesSaturday  Sponsor?: No   Rodneshia Greenhouse, LCAS

## 2013-01-07 NOTE — Progress Notes (Signed)
    Daily Group Progress Note  Program: CD-IOP   Group Time: 1-2:30 pm  Participation Level: Active  Behavioral Response: Sharing  Type of Therapy: Process Group  Topic: Group Process: Members shared about their current struggles and issues in early recovery. They shared what they are doing to support their recovery. There was good feedback and disclosure among the members.   Group Time: 2:45- 4pm  Participation Level: Active  Behavioral Response: Sharing and apologetic  Type of Therapy: Psycho-education Group  Topic: [facility] Slide Show/Honesty about Drug Tests: Triggers, Cravings, and Recovery. The second half of group was spent in a psych-ed presentation with slide shows from [facility]. Members were provided education on the way triggers develop and the strengthening of the condition response to cues as one's addiction progresses. The second show focused on the 4 phases of recovery and obstacles one can anticipate. At the conclusion of these presentations, two members were challenged about the results of the drug test, both of which indicated alcohol use. They both admitted using while previously neither had confessed their use despite both emphasizing the importance of honesty in recovery.  Summary: The patient reported he was doing good and had attended 2 meetings. He reported his sobriety date remained 3/27. He was seen whispering and giggling with another member during the slide shows. This patient is very disruptive and challenging in group. At the conclusion of the psycho-ed, I handed the patient the results of his drug test collected on Monday. I asked him if he would explain it? He asked what it was and then read that it was positive for alcohol. He shook his head and admitted he had gone out to eat with his mother and sister and he had had a 'Captain and Coke". He had to explain to a fellow group member that he was talking about a rum and coke. He couldn't  explain why he even ordered, but denied having more than one drink. I questioned his mother supporting him and allowing him to drink alcohol, but he explained they don't think about alcohol being a problem. I reminded him that if he had told me this, I could have met with his family and explained the disease concept of addiction. I explained that the patient would be leaving the group and wondered whether he would want to say something to his fellow group members? He apologized to the group for leeting them down, but insisted he would do what he had to to remain drug-free. He hugged all the members and thanked them. The patient will be discharged upon completion of this session.    Family Program: Family present? No   Name of family member(s):   UDS collected: No Result: :   AA/NA attended?: [place] and [place]  Sponsor?: Yes, he has told the group he has a new sponsor   Garrett Bowring, LCAS

## 2013-01-08 ENCOUNTER — Encounter (HOSPITAL_COMMUNITY): Payer: Self-pay

## 2013-05-12 ENCOUNTER — Emergency Department (HOSPITAL_COMMUNITY)
Admission: EM | Admit: 2013-05-12 | Discharge: 2013-05-12 | Disposition: A | Discharge status: Home / Self Care | Payer: Self-pay | Attending: Emergency Medicine | Admitting: Emergency Medicine

## 2013-05-12 ENCOUNTER — Encounter (HOSPITAL_COMMUNITY): Payer: Self-pay

## 2013-05-12 DIAGNOSIS — Y929 Unspecified place or not applicable: Secondary | ICD-10-CM | POA: Insufficient documentation

## 2013-05-12 DIAGNOSIS — T401X1A Poisoning by heroin, accidental (unintentional), initial encounter: Secondary | ICD-10-CM | POA: Insufficient documentation

## 2013-05-12 DIAGNOSIS — I10 Essential (primary) hypertension: Secondary | ICD-10-CM | POA: Insufficient documentation

## 2013-05-12 DIAGNOSIS — R000 Tachycardia, unspecified: Secondary | ICD-10-CM | POA: Insufficient documentation

## 2013-05-12 DIAGNOSIS — T401X4A Poisoning by heroin, undetermined, initial encounter: Secondary | ICD-10-CM | POA: Insufficient documentation

## 2013-05-12 DIAGNOSIS — Z8619 Personal history of other infectious and parasitic diseases: Secondary | ICD-10-CM | POA: Insufficient documentation

## 2013-05-12 DIAGNOSIS — F17200 Nicotine dependence, unspecified, uncomplicated: Secondary | ICD-10-CM | POA: Insufficient documentation

## 2013-05-12 DIAGNOSIS — Y9389 Activity, other specified: Secondary | ICD-10-CM | POA: Insufficient documentation

## 2013-05-12 NOTE — ED Notes (Signed)
He remains awake, alert and in no distress.  His heart rate goes up at times (~120-130) while speaking with his mother and or the police; however, he seems calm and uses realistic phrases and tells [place] he has a plan in place to seek treatment for his substance abuse issues.

## 2013-05-12 NOTE — ED Provider Notes (Signed)
CSN: [id number]     Arrival date & time 05/12/13  1341 History   First MD Initiated Contact with Patient 05/12/13 1405     Chief Complaint  Patient presents with  . Medical Clearance  . heroine overdose     HPI The patient presents to the emergency room after an accidental heroin overdose. 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 Previously had a significant overdose requiring hospitalization for several days. Patient states he had been clean for a period of time. Over the last few months he has started using intermittently again. The patient has been using much less than he previously did. The patient was at work when he went into the restroom and injected himself with heroin. Patient is not sure what happened but he must have been found by a coworker. EMS was contacted. The patient was found unresponsive with diminished respirations. EMS gave the patient 2 mg Narcan with complete resolution of his symptoms.  The patient denies any suicidal intent. He states he thinks the heroine was more potent and is suspected and he also had not been using as much as he had in the past. Patient does have plans to get treatment and has artery contacted treatment centers for his heroin addiction. He is not interested in any treatment here in the emergency department and feels that he will make this arrangement on his own. The patient is currently on parole pain does not want to do anything to interfere with that. Past Medical History  Diagnosis Date  . Hepatitis C   . Hypertension    Past Surgical History  Procedure Laterality Date  . Appendectomy     Family History  Problem Relation Age of Onset  . Alcohol abuse Father   . Alcohol abuse Paternal Grandfather    History  Substance Use Topics  . Smoking status: Current Every Day Smoker -- 1.00 packs/day    Types: Cigarettes  . Smokeless tobacco: Not on file  . Alcohol Use: 0.6 oz/week    1 Shots of liquor per week    Review of  Systems  All other systems reviewed and are negative.    Allergies  Review of patient's allergies indicates no known allergies.  Home Medications  No current outpatient prescriptions on file. BP 138/83  Pulse 115  Temp(Src) 99.8 F (37.7 C) (Oral)  Resp 20  SpO2 99% Physical Exam  Nursing note and vitals reviewed. Constitutional: He appears well-developed and well-nourished. No distress.  HENT:  Head: Normocephalic and atraumatic.  Right Ear: External ear normal.  Left Ear: External ear normal.  Eyes: Conjunctivae are normal. Right eye exhibits no discharge. Left eye exhibits no discharge. No scleral icterus.  Neck: Neck supple. No tracheal deviation present.  Cardiovascular: Regular rhythm and intact distal pulses.   Tachycardic  Pulmonary/Chest: Effort normal and breath sounds normal. No stridor. No respiratory distress. He has no wheezes. He has no rales.  Abdominal: Soft. Bowel sounds are normal. He exhibits no distension. There is no tenderness. There is no rebound and no guarding.  Musculoskeletal: He exhibits no edema and no tenderness.  Neurological: He is alert. He has normal strength. No sensory deficit. Cranial nerve deficit:  no gross defecits noted. He exhibits normal muscle tone. He displays no seizure activity. Coordination normal.  Skin: Skin is warm and dry. No rash noted.  Psychiatric: He has a normal mood and affect. His speech is normal and behavior is normal. He expresses no homicidal and no  suicidal ideation. He expresses no suicidal plans and no homicidal plans.    ED Course  EKG  sinus tachycardia rate 108  Normal axis, normal intervals Borderline T-wave abnormalities diffusely No prior EKG for comparison Procedures (including critical care time)  1550  Pt has remained alert in no distress.  No respiratory depression.  Stable for discharge. Family present at the bedside. Labs Reviewed - No data to display No results found. 1. Accidental heroin  overdose, initial encounter     MDM  The patient was concerned about getting in trouble with his parole officer. I explained to the patient that he could tell his parole officer that he was in the hospital. His parole officer could call the emergency department and we would confirm that he was here but would not release any personal details about his visit.  The patient agrees and he will stay here for observation.  I plan on watching the patient emergency department for a few hours to make sure that his symptoms do not return as the Narcan wears off. The patient was offered resources regarding his heroin addiction but  he seems to have plans to get treatment on his own.  Celene Kras, MD 05/12/13 [phone]

## 2013-05-12 NOTE — Progress Notes (Signed)
[facility] provided patient with a [facility] Orange Card application, highlighting [facility].  °

## 2013-05-12 NOTE — ED Notes (Signed)
Per ems pt was found in a [facility] bathroom with a needle sticking out of his arm, unconscious, agonal respirations 4x/ min, pinpoint pupils. 18 G R AC, 2mg  Narcan given, pt became alert and oriented x4. Pt admitted heroine use.   Pt has ankle bracelet on, talking about wanting to leave hospital, if not home by 1530 pt "will incur jail time".

## 2013-05-12 NOTE — ED Notes (Signed)
Bed: RESA Expected date:  Expected time:  Means of arrival:  Comments: ems- heroine overdose

## 2013-05-12 NOTE — ED Notes (Signed)
He states he was using heroin today.  The first thing he tells [place] is that he has "to be somewhere by 3:30 or I'll be in violation of my parole".  I then notice a location bracelet on his left ankle.  I inform him that we would need him to stay, as the Narcan may wear off.  He insists upon leaving, at which time we inform him we will not hesitate to compel him to stay, even if police must be involved.  He agrees to stay.  He is alert and oriented x 4 with clear speech.  He is anxious, but not overly so.

## 2013-07-29 ENCOUNTER — Other Ambulatory Visit: Payer: Self-pay

## 2014-04-04 ENCOUNTER — Emergency Department (HOSPITAL_COMMUNITY): Payer: Self-pay

## 2014-04-04 ENCOUNTER — Emergency Department (HOSPITAL_COMMUNITY)
Admission: EM | Admit: 2014-04-04 | Discharge: 2014-04-04 | Disposition: A | Discharge status: Home / Self Care | Payer: Self-pay | Attending: Emergency Medicine | Admitting: Emergency Medicine

## 2014-04-04 ENCOUNTER — Encounter (HOSPITAL_COMMUNITY): Payer: Self-pay | Admitting: Emergency Medicine

## 2014-04-04 DIAGNOSIS — I10 Essential (primary) hypertension: Secondary | ICD-10-CM | POA: Insufficient documentation

## 2014-04-04 DIAGNOSIS — S0230XA Fracture of orbital floor, unspecified side, initial encounter for closed fracture: Secondary | ICD-10-CM | POA: Insufficient documentation

## 2014-04-04 DIAGNOSIS — F17200 Nicotine dependence, unspecified, uncomplicated: Secondary | ICD-10-CM | POA: Insufficient documentation

## 2014-04-04 DIAGNOSIS — Z8619 Personal history of other infectious and parasitic diseases: Secondary | ICD-10-CM | POA: Insufficient documentation

## 2014-04-04 DIAGNOSIS — IMO0002 Reserved for concepts with insufficient information to code with codable children: Secondary | ICD-10-CM | POA: Insufficient documentation

## 2014-04-04 DIAGNOSIS — Y9389 Activity, other specified: Secondary | ICD-10-CM | POA: Insufficient documentation

## 2014-04-04 DIAGNOSIS — Y929 Unspecified place or not applicable: Secondary | ICD-10-CM | POA: Insufficient documentation

## 2014-04-04 MED ORDER — HYDROCODONE-ACETAMINOPHEN 5-325 MG PO TABS: 1.0000 | ORAL_TABLET | Freq: Four times a day (QID) | ORAL | Status: DC | PRN

## 2014-04-04 MED ORDER — IBUPROFEN 600 MG PO TABS: 600.0000 mg | ORAL_TABLET | Freq: Four times a day (QID) | ORAL | Status: DC | PRN

## 2014-04-04 MED ORDER — CEPHALEXIN 500 MG PO CAPS: 500.0000 mg | ORAL_CAPSULE | Freq: Four times a day (QID) | ORAL | Status: DC

## 2014-04-04 NOTE — Discharge Instructions (Signed)
You have fracture of the eye bone. YOU MUST APPLY COLD COMPRESSES. YOU MUST NOT - NOT BLOW YOUR NOSE.  See ENT doctors as requested. Return to the ER if the vision gets worse, pain gets worse, you cant move the eyes or eyes pop out.   Orbital Floor Fracture, Blowout The eye sits in the bony structure of the skull called the orbit. The upper and outside walls of the orbit are very thick and strong. These walls protect the eye if the head is struck from the top or side of the eye. However, the inside wall near the nose and the orbit floor are very thin and weak. The bony floor of the orbit also acts as the roof of the air-filled space (sinus) below the orbit. If the eye receives a direct blow from the front, all the tissues around the eye are briefly pressed together. This makes the orbital wall pressure very high. Since the weakest walls tend to give way first, the inside wall or the orbit floor may break. If the floor fractures, the tissues around the eye, including the muscle that is used to make the eye look down, may become trapped within the fracture as the floor of the orbit "blows out" into the sinus below.  CAUSES  Orbital floor fractures are caused by direct (blunt) trauma to the region of the eye. SYMPTOMS  Assuming that there has been no injury to the eye itself, symptoms can include:  Puffiness (swelling) and bruising around the eye area (black eye).  A gurgling sound when pressure is placed on the eye area. This sound comes from air that has escaped from the sinus into the space around the eye (orbital emphysema).  Seeing two of everything - one object being higher than the other (vertical diplopia). This is the result of the muscle that moves the eye down being trapped within the fracture. Since it cannot relax, the eye is being held in a downward position relative to the other eye and cannot look up. Vertical diplopia from an orbital floor fracture is worse when looking up.  Pain  around the eye when looking up.  One eye looks sunken compared to the other eye (enophthalmos).  Numbness of the cheek and upper gum on the same side of the face with the floor fracture. This is a result of nerve injury to these areas. This nerve runs in a groove along the bone of the orbital floor on its way to the cheek and upper gums. DIAGNOSIS  The diagnosis of an orbital floor fracture is suspected during an eye exam by an ophthalmologist. It is confirmed by X-rays or CT scan of the eye region. TREATMENT   Orbital floor fractures are not usually treated until all of the swelling around the eye has gone away. This may take 1 or 2 weeks. Once the swelling has gone down, an ophthalmologist will see if if the muscle below the eye is still trapped within the fracture.  If there is no sign of a trapped muscle or vertical diplopia, treatment is not necessary.  If there is double vision only when looking up, a decision may be made to not do anything since most people do not spend a lot of time looking up. This may depend on the person's profession. For instance, a Nutritional therapistplumber or electrician may spend a large part of their day looking up and would therefore need treatment.  If there is persistent vertical double vision even when looking straight ahead,  the ophthalmologist may try to free the muscle in the office. If this is unsuccessful, surgery is often needed. SEEK IMMEDIATE MEDICAL CARE IF:  You have had a blow to the region of your eyes and have:  A drop in vision in either eye.  Swelling and bruising around either eye.  One eye seems to be "sunken" compared to the other.  You see two of everything with both eyes open when looking in any direction.  The two images get further apart when looking in a certain direction - especially up.  You have numbness of the cheek and upper gums on the side of the injury.  You develop an unexplained oral temperature over 102 F (38.9 C), or as your  caregiver suggests. Document Released: 03/05/2001 Document Revised: 12/02/2011 Document Reviewed: 10/25/2011 [facility] Patient Information 2015 [place], [place]. This information is not intended to replace advice given to you by your health care provider. Make sure you discuss any questions you have with your health care provider.

## 2014-04-04 NOTE — ED Notes (Signed)
Pager [phone] 

## 2014-04-04 NOTE — ED Notes (Signed)
PA Katiyln at bedside assessing. Pt denies needing for pain at this time. NAD.
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
## 2014-04-04 NOTE — ED Notes (Signed)
Ct results noted. Pt acuity changed accordingly.
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
## 2014-06-08 ENCOUNTER — Encounter (HOSPITAL_COMMUNITY): Payer: Self-pay | Admitting: Emergency Medicine

## 2014-06-08 ENCOUNTER — Emergency Department (HOSPITAL_COMMUNITY)
Admission: EM | Admit: 2014-06-08 | Discharge: 2014-06-08 | Discharge status: Against Medical Advice | Payer: Self-pay | Attending: Emergency Medicine | Admitting: Emergency Medicine

## 2014-06-08 DIAGNOSIS — I10 Essential (primary) hypertension: Secondary | ICD-10-CM | POA: Insufficient documentation

## 2014-06-08 DIAGNOSIS — T401X4A Poisoning by heroin, undetermined, initial encounter: Secondary | ICD-10-CM | POA: Insufficient documentation

## 2014-06-08 DIAGNOSIS — Y9389 Activity, other specified: Secondary | ICD-10-CM | POA: Insufficient documentation

## 2014-06-08 DIAGNOSIS — Z8619 Personal history of other infectious and parasitic diseases: Secondary | ICD-10-CM | POA: Insufficient documentation

## 2014-06-08 DIAGNOSIS — F17200 Nicotine dependence, unspecified, uncomplicated: Secondary | ICD-10-CM | POA: Insufficient documentation

## 2014-06-08 DIAGNOSIS — Y9289 Other specified places as the place of occurrence of the external cause: Secondary | ICD-10-CM | POA: Insufficient documentation

## 2014-06-08 DIAGNOSIS — R000 Tachycardia, unspecified: Secondary | ICD-10-CM | POA: Insufficient documentation

## 2014-06-08 LAB — CBC WITH DIFFERENTIAL/PLATELET
BASOS ABS: 0 10*3/uL (ref 0.0–0.1)
BASOS PCT: 0 % (ref 0–1)
EOS ABS: 0.1 10*3/uL (ref 0.0–0.7)
Eosinophils Relative: 1 % (ref 0–5)
HCT: 47.4 % (ref 39.0–52.0)
Hemoglobin: 15.7 g/dL (ref 13.0–17.0)
Lymphocytes Relative: 17 % (ref 12–46)
Lymphs Abs: 1.9 10*3/uL (ref 0.7–4.0)
MCH: 32 pg (ref 26.0–34.0)
MCHC: 33.1 g/dL (ref 30.0–36.0)
MCV: 96.5 fL (ref 78.0–100.0)
MONOS PCT: 6 % (ref 3–12)
Monocytes Absolute: 0.7 10*3/uL (ref 0.1–1.0)
NEUTROS PCT: 76 % (ref 43–77)
Neutro Abs: 8.4 10*3/uL — ABNORMAL HIGH (ref 1.7–7.7)
PLATELETS: 171 10*3/uL (ref 150–400)
RBC: 4.91 MIL/uL (ref 4.22–5.81)
RDW: 12.7 % (ref 11.5–15.5)
WBC: 11.1 10*3/uL — ABNORMAL HIGH (ref 4.0–10.5)

## 2014-06-08 LAB — URINALYSIS, ROUTINE W REFLEX MICROSCOPIC
BILIRUBIN URINE: NEGATIVE
GLUCOSE, UA: 100 mg/dL — AB
Hgb urine dipstick: NEGATIVE
KETONES UR: NEGATIVE mg/dL
Leukocytes, UA: NEGATIVE
Nitrite: NEGATIVE
Protein, ur: NEGATIVE mg/dL
Specific Gravity, Urine: 1.015 (ref 1.005–1.030)
Urobilinogen, UA: 0.2 mg/dL (ref 0.0–1.0)
pH: 6 (ref 5.0–8.0)

## 2014-06-08 LAB — BLOOD GAS, VENOUS
Acid-base deficit: 3.3 mmol/L — ABNORMAL HIGH (ref 0.0–2.0)
BICARBONATE: 23.3 meq/L (ref 20.0–24.0)
O2 SAT: 54.1 %
PATIENT TEMPERATURE: 98.6
PH VEN: 7.295 (ref 7.250–7.300)
TCO2: 20.8 mmol/L (ref 0–100)
pCO2, Ven: 49.4 mmHg (ref 45.0–50.0)
pO2, Ven: 34.5 mmHg (ref 30.0–45.0)

## 2014-06-08 LAB — COMPREHENSIVE METABOLIC PANEL
ALT: 54 U/L — AB (ref 0–53)
AST: 52 U/L — ABNORMAL HIGH (ref 0–37)
Albumin: 4.1 g/dL (ref 3.5–5.2)
Alkaline Phosphatase: 57 U/L (ref 39–117)
Anion gap: 18 — ABNORMAL HIGH (ref 5–15)
BILIRUBIN TOTAL: 0.2 mg/dL — AB (ref 0.3–1.2)
BUN: 12 mg/dL (ref 6–23)
CO2: 21 mEq/L (ref 19–32)
Calcium: 8.6 mg/dL (ref 8.4–10.5)
Chloride: 105 mEq/L (ref 96–112)
Creatinine, Ser: 1.35 mg/dL (ref 0.50–1.35)
GFR, EST AFRICAN AMERICAN: 80 mL/min — AB (ref >90)
GFR, EST NON AFRICAN AMERICAN: 69 mL/min — AB (ref >90)
Glucose, Bld: 221 mg/dL — ABNORMAL HIGH (ref 70–99)
POTASSIUM: 4.1 meq/L (ref 3.7–5.3)
SODIUM: 144 meq/L (ref 137–147)
TOTAL PROTEIN: 7.3 g/dL (ref 6.0–8.3)

## 2014-06-08 LAB — RAPID URINE DRUG SCREEN, HOSP PERFORMED
AMPHETAMINES: NOT DETECTED
Barbiturates: NOT DETECTED
Benzodiazepines: NOT DETECTED
Cocaine: POSITIVE — AB
OPIATES: POSITIVE — AB
Tetrahydrocannabinol: NOT DETECTED

## 2014-06-08 LAB — SALICYLATE LEVEL

## 2014-06-08 LAB — ACETAMINOPHEN LEVEL

## 2014-06-08 LAB — I-STAT TROPONIN, ED: Troponin i, poc: 0.01 ng/mL (ref 0.00–0.08)

## 2014-06-08 LAB — ETHANOL: ALCOHOL ETHYL (B): 170 mg/dL — AB (ref 0–11)

## 2014-06-08 LAB — I-STAT CG4 LACTIC ACID, ED: Lactic Acid, Venous: 3.74 mmol/L — ABNORMAL HIGH (ref 0.5–2.2)

## 2014-06-08 MED ORDER — SODIUM CHLORIDE 0.9 % IV BOLUS (SEPSIS)
1000.0000 mL | Freq: Once | INTRAVENOUS | Status: AC
  Administered 2014-06-08: 1000 mL via INTRAVENOUS

## 2014-06-08 NOTE — ED Provider Notes (Signed)
Medical screening examination/treatment/procedure(s) were performed by non-physician practitioner and as supervising physician I was immediately available for consultation/collaboration.   EKG Interpretation   Date/Time:  Wednesday June 08 2014 02:04:00 EDT Ventricular Rate:  127 PR Interval:  90 QRS Duration: 83 QT Interval:  419 QTC Calculation: 609 R Axis:   66 Text Interpretation:  Sinus tachycardia Probable left atrial enlargement  RSR' in V1 or V2, probably normal variant Borderline repolarization  abnormality Prolonged QT interval Artifact in lead(s) I II aVR aVL aVF QT  prolonged since previous  Confirmed by YAO  MD, DAVID ([id number]) on 06/08/2014  7:27:25 AM        Richardean Canal, MD 06/08/14 [phone]

## 2014-06-08 NOTE — ED Notes (Signed)
Brought in by EMS from his car off [address] with c/o drug overdose.  Per EMS, pt was found unresponsive in his car with his friend--- [employer] was at the scene on EMS' arrival.  Pt was respiratory depressed and unresponsive--- pt was given Narcan 2 mg IV without effect; was given another Narcan 1 mg IV and pt became responsive.  Pt presents to ED A/Ox4, fully awake and verbally responsive, no s/s distress noted.

## 2014-06-08 NOTE — ED Notes (Signed)
Pt ran out of EMS bay doors with his friend, pt's IV was still in his arm, [employer] and security went after him and was unable to find the patient.

## 2014-06-08 NOTE — ED Notes (Signed)
Pt reports taking heroin--- states, "Not too much, but just the usual amount".

## 2014-06-08 NOTE — ED Notes (Signed)
Bed: RESA Expected date: 06/08/14 Expected time: 1:49 AM Means of arrival: Ambulance Comments: Heroin overdose

## 2014-06-08 NOTE — ED Provider Notes (Signed)
CSN: [id number]     Arrival date & time 06/08/14  0200 History   First MD Initiated Contact with Patient 06/08/14 [phone]     Chief Complaint  Patient presents with  . Drug Overdose    (Consider location/radiation/quality/duration/timing/severity/associated sxs/prior Treatment) HPI Comments: 28 year old male with a history of hepatitis C and hypertension presents to the emergency department via EMS for drug overdose. Patient was found unresponsive by [employer]. Per EMS, patient had respiratory depression on scene. He was given 2 mg IV Narcan with little effect, however, became responsive after an additional 1 mg IV Narcan. Patient has been alert and oriented x3 since this time. Patient states that he used approximately 0.1 g IV heroin. He states he has not used heroin for a year. Patient with no complaints at this time; no chest pain, shortness of breath, nausea, vomiting, abdominal pain, or weakness.  Patient is a 28 y.o. male presenting with Overdose. The history is provided by the patient. No language interpreter was used.  Drug Overdose Pertinent negatives include no abdominal pain, chest pain, nausea, vomiting or weakness.    Past Medical History  Diagnosis Date  . Hepatitis C   . Hypertension    Past Surgical History  Procedure Laterality Date  . Appendectomy     Family History  Problem Relation Age of Onset  . Alcohol abuse Father   . Alcohol abuse Paternal Grandfather    History  Substance Use Topics  . Smoking status: Current Every Day Smoker -- 1.00 packs/day    Types: Cigarettes  . Smokeless tobacco: Not on file  . Alcohol Use: 0.6 oz/week    1 Shots of liquor per week    Review of Systems  Respiratory: Negative for shortness of breath.   Cardiovascular: Negative for chest pain.  Gastrointestinal: Negative for nausea, vomiting and abdominal pain.  Neurological: Negative for weakness.       Unresponsive on EMS arrival  All other systems reviewed and are  negative.   Allergies  Review of patient's allergies indicates no known allergies.  Home Medications   Prior to Admission medications   Not on File   BP 136/95  Pulse 127  Temp(Src) 97.8 F (36.6 C) (Oral)  Resp 17  Ht  (1.803 m)  Wt 175 lb (79.379 kg)  BMI 24.42 kg/m2  SpO2 100%  Physical Exam  Nursing note and vitals reviewed. Constitutional: He is oriented to person, place, and time. He appears well-developed and well-nourished. No distress.  Nontoxic/nonseptic appearing  HENT:  Head: Normocephalic and atraumatic.  Eyes: Conjunctivae and EOM are normal. No scleral icterus.  Neck: Normal range of motion.  Cardiovascular: Regular rhythm and normal heart sounds.  Tachycardia present.   Pulmonary/Chest: Effort normal and breath sounds normal. No respiratory distress. He has no wheezes. He has no rales.  Chest expansion symmetric  Abdominal: Soft. He exhibits no distension. There is no tenderness. There is no rebound and no guarding.  Soft, nontender  Musculoskeletal: Normal range of motion.  Neurological: He is alert and oriented to person, place, and time. He exhibits normal muscle tone. Coordination normal.  GCS 15. Speech is goal oriented. Patient answers questions appropriately and follows simple commands. Patient moves extremities without ataxia.  Skin: Skin is warm and dry. No rash noted. He is not diaphoretic. No erythema. No pallor.  Psychiatric: He has a normal mood and affect. His behavior is normal.    ED Course  Procedures (including critical care time) Labs Review Labs Reviewed  CBC WITH DIFFERENTIAL - Abnormal; Notable for the following:    WBC 11.1 (*)    Neutro Abs 8.4 (*)    All other components within normal limits  COMPREHENSIVE METABOLIC PANEL - Abnormal; Notable for the following:    Glucose, Bld 221 (*)    AST 52 (*)    ALT 54 (*)    Total Bilirubin 0.2 (*)    GFR calc non Af Amer 69 (*)    GFR calc Af Amer 80 (*)    Anion gap 18 (*)     All other components within normal limits  URINE RAPID DRUG SCREEN (HOSP PERFORMED) - Abnormal; Notable for the following:    Opiates POSITIVE (*)    Cocaine POSITIVE (*)    All other components within normal limits  ETHANOL - Abnormal; Notable for the following:    Alcohol, Ethyl (B) 170 (*)    All other components within normal limits  SALICYLATE LEVEL - Abnormal; Notable for the following:    Salicylate Lvl <2.0 (*)    All other components within normal limits  BLOOD GAS, VENOUS - Abnormal; Notable for the following:    Acid-base deficit 3.3 (*)    All other components within normal limits  URINALYSIS, ROUTINE W REFLEX MICROSCOPIC - Abnormal; Notable for the following:    Glucose, UA 100 (*)    All other components within normal limits  I-STAT CG4 LACTIC ACID, ED - Abnormal; Notable for the following:    Lactic Acid, Venous 3.74 (*)    All other components within normal limits  ACETAMINOPHEN LEVEL  I-STAT TROPOININ, ED   Imaging Review No results found.   EKG Interpretation None      MDM   Final diagnoses:  Heroin overdose, undetermined intent, initial encounter    28 year old male presents to the emergency department via EMS. Patient found unresponsive with respiratory depression by [employer] and responded after 3 mg IV Narcan. On arrival, patient alert and oriented x3 and without any complaints. Physical exam is significant for tachycardia.  Labs today significant for mild leukocytosis of 11.1 as well as elevated lactate. Venous blood gas pH WNL. Tx initiated on ED arrival with IVF. While labs pending, however, patient and friend (who also presented to the ED for overdose) eloped from the department prior to discharge or reevaluation. Security attempted to look for the patient unsuccessfully. Patient to be signed out AMA.   Filed Vitals:   06/08/14 0203  BP: 136/95  Pulse: 127  Temp: 97.8 F (36.6 C)  TempSrc: Oral  Resp: 17  Height:  (1.803 m)  Weight: 175  lb (79.379 kg)  SpO2: 100%     Antony Madura, PA-C 06/08/14 [phone]

## 2014-06-20 ENCOUNTER — Emergency Department (HOSPITAL_COMMUNITY)
Admission: EM | Admit: 2014-06-20 | Discharge: 2014-06-20 | Disposition: A | Discharge status: Home / Self Care | Payer: Self-pay | Attending: Emergency Medicine | Admitting: Emergency Medicine

## 2014-06-20 ENCOUNTER — Encounter (HOSPITAL_COMMUNITY): Payer: Self-pay | Admitting: Emergency Medicine

## 2014-06-20 DIAGNOSIS — Z8619 Personal history of other infectious and parasitic diseases: Secondary | ICD-10-CM | POA: Insufficient documentation

## 2014-06-20 DIAGNOSIS — IMO0001 Reserved for inherently not codable concepts without codable children: Secondary | ICD-10-CM | POA: Insufficient documentation

## 2014-06-20 DIAGNOSIS — I10 Essential (primary) hypertension: Secondary | ICD-10-CM | POA: Insufficient documentation

## 2014-06-20 DIAGNOSIS — M25579 Pain in unspecified ankle and joints of unspecified foot: Secondary | ICD-10-CM | POA: Insufficient documentation

## 2014-06-20 DIAGNOSIS — S92251S Displaced fracture of navicular [scaphoid] of right foot, sequela: Secondary | ICD-10-CM

## 2014-06-20 DIAGNOSIS — F17200 Nicotine dependence, unspecified, uncomplicated: Secondary | ICD-10-CM | POA: Insufficient documentation

## 2014-06-20 MED ORDER — IBUPROFEN 800 MG PO TABS: 800.0000 mg | ORAL_TABLET | Freq: Three times a day (TID) | ORAL | Status: AC

## 2014-06-20 MED ORDER — HYDROCODONE-ACETAMINOPHEN 5-325 MG PO TABS
1.0000 | ORAL_TABLET | Freq: Four times a day (QID) | ORAL | Status: AC | PRN
Start: 2014-06-20 — End: ?

## 2014-06-20 MED ORDER — ACETAMINOPHEN 325 MG PO TABS
650.0000 mg | ORAL_TABLET | Freq: Once | ORAL | Status: AC
  Administered 2014-06-20: 650 mg via ORAL

## 2014-06-20 MED ORDER — ACETAMINOPHEN 325 MG PO TABS
325.0000 mg | ORAL_TABLET | Freq: Once | ORAL | Status: DC
  Filled 2014-06-20: qty 1

## 2014-06-20 NOTE — ED Notes (Signed)
Pt's mother requesting pt have Tylenol.

## 2014-06-20 NOTE — ED Provider Notes (Signed)
Medical screening examination/treatment/procedure(s) were performed by non-physician practitioner and as supervising physician I was immediately available for consultation/collaboration.    Vida Roller, MD 06/20/14 [phone]

## 2014-06-20 NOTE — ED Notes (Signed)
PT reports injury to Rt leg ,rt ankle and Rt foot. Pt also has numbness to same area.
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
## 2014-06-20 NOTE — ED Notes (Signed)
Ortho tech notified.  

## 2014-06-20 NOTE — Discharge Instructions (Signed)
Call for a follow up appointment with a Family or Primary Care Provider.  Return if Symptoms worsen.   Take medication as prescribed.  Call a Orthopedic provider for further evaluation of your ankle injury. Do not operate heavy machinery or drink alcohol while taking narcotic pain medication.

## 2014-06-20 NOTE — Progress Notes (Signed)
Orthopedic Tech Progress Note Patient Details:  Austin Nielsen [birth year] [id number]  Ortho Devices Type of Ortho Device: Crutches;Post (short leg) splint Ortho Device/Splint Interventions: Application   Shawnie Pons 06/20/2014, 12:36 PM

## 2014-06-20 NOTE — ED Notes (Signed)
Ortho tech in. 

## 2014-08-17 ENCOUNTER — Emergency Department (HOSPITAL_COMMUNITY)
Admission: EM | Admit: 2014-08-17 | Discharge: 2014-08-17 | Disposition: A | Discharge status: Home / Self Care | Payer: Self-pay | Attending: Emergency Medicine | Admitting: Emergency Medicine

## 2014-08-17 ENCOUNTER — Encounter (HOSPITAL_COMMUNITY): Payer: Self-pay | Admitting: Emergency Medicine

## 2014-08-17 DIAGNOSIS — Z791 Long term (current) use of non-steroidal anti-inflammatories (NSAID): Secondary | ICD-10-CM | POA: Insufficient documentation

## 2014-08-17 DIAGNOSIS — R1111 Vomiting without nausea: Secondary | ICD-10-CM | POA: Insufficient documentation

## 2014-08-17 DIAGNOSIS — Z720 Tobacco use: Secondary | ICD-10-CM | POA: Insufficient documentation

## 2014-08-17 DIAGNOSIS — R112 Nausea with vomiting, unspecified: Secondary | ICD-10-CM

## 2014-08-17 DIAGNOSIS — R197 Diarrhea, unspecified: Secondary | ICD-10-CM | POA: Insufficient documentation

## 2014-08-17 DIAGNOSIS — I10 Essential (primary) hypertension: Secondary | ICD-10-CM | POA: Insufficient documentation

## 2014-08-17 DIAGNOSIS — R6883 Chills (without fever): Secondary | ICD-10-CM | POA: Insufficient documentation

## 2014-08-17 DIAGNOSIS — Z8619 Personal history of other infectious and parasitic diseases: Secondary | ICD-10-CM | POA: Insufficient documentation

## 2014-08-17 LAB — URINALYSIS, ROUTINE W REFLEX MICROSCOPIC
Bilirubin Urine: NEGATIVE
Glucose, UA: NEGATIVE mg/dL
Hgb urine dipstick: NEGATIVE
KETONES UR: NEGATIVE mg/dL
LEUKOCYTES UA: NEGATIVE
NITRITE: NEGATIVE
PH: 7 (ref 5.0–8.0)
Protein, ur: NEGATIVE mg/dL
Specific Gravity, Urine: 1.008 (ref 1.005–1.030)
Urobilinogen, UA: 0.2 mg/dL (ref 0.0–1.0)

## 2014-08-17 LAB — COMPREHENSIVE METABOLIC PANEL
ALBUMIN: 3.9 g/dL (ref 3.5–5.2)
ALT: 54 U/L — ABNORMAL HIGH (ref 0–53)
ANION GAP: 13 (ref 5–15)
AST: 30 U/L (ref 0–37)
Alkaline Phosphatase: 61 U/L (ref 39–117)
BILIRUBIN TOTAL: 0.5 mg/dL (ref 0.3–1.2)
BUN: 11 mg/dL (ref 6–23)
CHLORIDE: 97 meq/L (ref 96–112)
CO2: 27 mEq/L (ref 19–32)
CREATININE: 0.87 mg/dL (ref 0.50–1.35)
Calcium: 9.3 mg/dL (ref 8.4–10.5)
GFR calc Af Amer: >90 mL/min (ref >90)
GFR calc non Af Amer: >90 mL/min (ref >90)
Glucose, Bld: 94 mg/dL (ref 70–99)
Potassium: 3.4 mEq/L — ABNORMAL LOW (ref 3.7–5.3)
Sodium: 137 mEq/L (ref 137–147)
Total Protein: 7.2 g/dL (ref 6.0–8.3)

## 2014-08-17 LAB — CBC WITH DIFFERENTIAL/PLATELET
BASOS ABS: 0 10*3/uL (ref 0.0–0.1)
BASOS PCT: 0 % (ref 0–1)
EOS PCT: 1 % (ref 0–5)
Eosinophils Absolute: 0.1 10*3/uL (ref 0.0–0.7)
HEMATOCRIT: 42.7 % (ref 39.0–52.0)
Hemoglobin: 13.9 g/dL (ref 13.0–17.0)
Lymphocytes Relative: 29 % (ref 12–46)
Lymphs Abs: 1.9 10*3/uL (ref 0.7–4.0)
MCH: 30.6 pg (ref 26.0–34.0)
MCHC: 32.6 g/dL (ref 30.0–36.0)
MCV: 94.1 fL (ref 78.0–100.0)
MONOS PCT: 8 % (ref 3–12)
Monocytes Absolute: 0.5 10*3/uL (ref 0.1–1.0)
NEUTROS ABS: 4 10*3/uL (ref 1.7–7.7)
Neutrophils Relative %: 62 % (ref 43–77)
Platelets: 133 10*3/uL — ABNORMAL LOW (ref 150–400)
RBC: 4.54 MIL/uL (ref 4.22–5.81)
RDW: 12 % (ref 11.5–15.5)
WBC: 6.5 10*3/uL (ref 4.0–10.5)

## 2014-08-17 MED ORDER — ONDANSETRON HCL 4 MG/2ML IJ SOLN
4.0000 mg | Freq: Once | INTRAMUSCULAR | Status: AC
  Administered 2014-08-17: 4 mg via INTRAVENOUS
  Filled 2014-08-17: qty 2

## 2014-08-17 MED ORDER — SODIUM CHLORIDE 0.9 % IV BOLUS (SEPSIS)
1000.0000 mL | Freq: Once | INTRAVENOUS | Status: AC
  Administered 2014-08-17: 1000 mL via INTRAVENOUS

## 2014-08-17 MED ORDER — ONDANSETRON 4 MG PO TBDP: 4.0000 mg | ORAL_TABLET | Freq: Three times a day (TID) | ORAL | Status: AC | PRN

## 2014-08-17 NOTE — ED Provider Notes (Signed)
CSN: [id number]     Arrival date & time 08/17/14  1603 History   First MD Initiated Contact with Patient 08/17/14 1643     Chief Complaint  Patient presents with  . Emesis  . Chills     (Consider location/radiation/quality/duration/timing/severity/associated sxs/prior Treatment) HPI Comments: Patient is a 28 yo M PMHx significant for Hepatitis C, HTN, tobacco abuse presenting to the ED for one week of nausea, multiple episodes of nonbloody nonbilious emesis, multiple episodes of nonbloody diarrhea without associated abdominal pain. Patient also endorses associated head congestion. No alleviating or aggravating factors. He has been able to tolerate by mouth liquids without difficulty. Denies any fevers, but endorses chills. No medications PTA. No history of abdominal surgeries.    Past Medical History  Diagnosis Date  . Hepatitis C   . Hypertension    Past Surgical History  Procedure Laterality Date  . Appendectomy     Family History  Problem Relation Age of Onset  . Alcohol abuse Father   . Alcohol abuse Paternal Grandfather    History  Substance Use Topics  . Smoking status: Current Every Day Smoker -- 1.00 packs/day    Types: Cigarettes  . Smokeless tobacco: Not on file  . Alcohol Use: 0.6 oz/week    1 Shots of liquor per week    Review of Systems  Constitutional: Positive for chills. Negative for fever.  Gastrointestinal: Positive for nausea, vomiting and diarrhea.  All other systems reviewed and are negative.     Allergies  Review of patient's allergies indicates no known allergies.  Home Medications   Prior to Admission medications   Medication Sig Start Date End Date Taking? Authorizing Provider  ibuprofen (ADVIL,MOTRIN) 800 MG tablet Take 1 tablet (800 mg total) by mouth 3 (three) times daily with meals. 06/20/14  Yes Mellody DrownLauren Parker, PA-C  HYDROcodone-acetaminophen (NORCO/VICODIN) 5-325 MG per tablet Take 1 tablet by mouth every 6 (six) hours as needed for  moderate pain or severe pain. Patient not taking: Reported on 08/17/2014 06/20/14   Mellody DrownLauren Parker, PA-C   BP 142/90 mmHg  Pulse 66  Temp(Src) 98.1 F (36.7 C) (Oral)  Resp 16  Ht 5\' 11"  (1.803 m)  Wt 170 lb (77.111 kg)  BMI 23.72 kg/m2  SpO2 100% Physical Exam  Constitutional: He is oriented to person, place, and time. He appears well-developed and well-nourished. No distress.  HENT:  Head: Normocephalic and atraumatic.  Right Ear: External ear normal.  Left Ear: External ear normal.  Nose: Nose normal.  Mouth/Throat: Oropharynx is clear and moist. Mucous membranes are dry. No oropharyngeal exudate.  Eyes: Conjunctivae are normal.  Neck: Normal range of motion. Neck supple.  Cardiovascular: Normal rate, regular rhythm and normal heart sounds.   Pulmonary/Chest: Effort normal and breath sounds normal. No respiratory distress.  Abdominal: Soft. Bowel sounds are normal. There is no tenderness.  Musculoskeletal: Normal range of motion.  Neurological: He is alert and oriented to person, place, and time.  Skin: Skin is warm and dry. He is not diaphoretic.  Psychiatric: He has a normal mood and affect.  Nursing note and vitals reviewed.   ED Course  Procedures (including critical care time) Medications  sodium chloride 0.9 % bolus 1,000 mL (0 mLs Intravenous Stopped 08/17/14 1755)  ondansetron (ZOFRAN) injection 4 mg (4 mg Intravenous Given 08/17/14 1719)    Labs Review Labs Reviewed  CBC WITH DIFFERENTIAL - Abnormal; Notable for the following:    Platelets 133 (*)    All other components  within normal limits  COMPREHENSIVE METABOLIC PANEL - Abnormal; Notable for the following:    Potassium 3.4 (*)    ALT 54 (*)    All other components within normal limits  URINALYSIS, ROUTINE W REFLEX MICROSCOPIC    Imaging Review No results found.   EKG Interpretation None      MDM   Final diagnoses:  Nausea vomiting and diarrhea    Filed Vitals:   08/17/14 1847  BP:  152/98  Pulse: 76  Temp:   Resp: 18   Afebrile, NAD, non-toxic appearing, AAOx4. I have reviewed nursing notes, vital signs, and all appropriate lab and imaging results for this patient. Patient with symptoms consistent with viral gastroenteritis.  Vitals are stable, no fever.  No signs of dehydration, tolerating PO fluids > 6 oz.  Lungs are clear.  No focal abdominal pain, no concern for appendicitis, cholecystitis, pancreatitis, ruptured viscus, UTI, kidney stone, or any other abdominal etiology.  Supportive therapy indicated with return if symptoms worsen.  Patient counseled. Return precautions discussed. Patient is agreeable to plan. Patient is stable at time of discharge       Jeannetta EllisJennifer L Corlette Ciano, PA-C 08/17/14 2349  Glynn OctaveStephen Rancour, MD 08/18/14 [phone]

## 2014-08-17 NOTE — ED Notes (Signed)
Pt c/o head congestion; pt c/o N/V and chills at times x 1 week

## 2014-08-17 NOTE — Discharge Instructions (Signed)
Please follow up with your primary care physician in 1-2 days. If you do not have one please call the [facility] number listed above. Please use Zofran as prescribed. Please follow diet below. Please read all discharge instructions and return precautions.   Viral Gastroenteritis Viral gastroenteritis is also known as stomach flu. This condition affects the stomach and intestinal tract. It can cause sudden diarrhea and vomiting. The illness typically lasts 3 to 8 days. Most people develop an immune response that eventually gets rid of the virus. While this natural response develops, the virus can make you quite ill. CAUSES  Many different viruses can cause gastroenteritis, such as rotavirus or noroviruses. You can catch one of these viruses by consuming contaminated food or water. You may also catch a virus by sharing utensils or other personal items with an infected person or by touching a contaminated surface. SYMPTOMS  The most common symptoms are diarrhea and vomiting. These problems can cause a severe loss of body fluids (dehydration) and a body salt (electrolyte) imbalance. Other symptoms may include:  Fever.  Headache.  Fatigue.  Abdominal pain. DIAGNOSIS  Your caregiver can usually diagnose viral gastroenteritis based on your symptoms and a physical exam. A stool sample may also be taken to test for the presence of viruses or other infections. TREATMENT  This illness typically goes away on its own. Treatments are aimed at rehydration. The most serious cases of viral gastroenteritis involve vomiting so severely that you are not able to keep fluids down. In these cases, fluids must be given through an intravenous line (IV). HOME CARE INSTRUCTIONS   Drink enough fluids to keep your urine clear or pale yellow. Drink small amounts of fluids frequently and increase the amounts as tolerated.  Ask your caregiver for specific rehydration instructions.  Avoid:  Foods  high in sugar.  Alcohol.  Carbonated drinks.  Tobacco.  Juice.  Caffeine drinks.  Extremely hot or cold fluids.  Fatty, greasy foods.  Too much intake of anything at one time.  Dairy products until 24 to 48 hours after diarrhea stops.  You may consume probiotics. Probiotics are active cultures of beneficial bacteria. They may lessen the amount and number of diarrheal stools in adults. Probiotics can be found in yogurt with active cultures and in supplements.  Wash your hands well to avoid spreading the virus.  Only take over-the-counter or prescription medicines for pain, discomfort, or fever as directed by your caregiver. Do not give aspirin to children. Antidiarrheal medicines are not recommended.  Ask your caregiver if you should continue to take your regular prescribed and over-the-counter medicines.  Keep all follow-up appointments as directed by your caregiver. SEEK IMMEDIATE MEDICAL CARE IF:   You are unable to keep fluids down.  You do not urinate at least once every 6 to 8 hours.  You develop shortness of breath.  You notice blood in your stool or vomit. This may look like coffee grounds.  You have abdominal pain that increases or is concentrated in one small area (localized).  You have persistent vomiting or diarrhea.  You have a fever.  The patient is a child younger than 3 months, and he or she has a fever.  The patient is a child older than 3 months, and he or she has a fever and persistent symptoms.  The patient is a child older than 3 months, and he or she has a fever and symptoms suddenly get worse.  The patient is a  baby, and he or she has no tears when crying. MAKE SURE YOU:   Understand these instructions.  Will watch your condition.  Will get help right away if you are not doing well or get worse. Document Released: 09/09/2005 Document Revised: 12/02/2011 Document Reviewed: 06/26/2011 [facility] Patient Information 2015 [place], [place].  This information is not intended to replace advice given to you by your health care provider. Make sure you discuss any questions you have with your health care provider.  Food Choices to Help Relieve Diarrhea When you have diarrhea, the foods you eat and your eating habits are very important. Choosing the right foods and drinks can help relieve diarrhea. Also, because diarrhea can last up to 7 days, you need to replace lost fluids and electrolytes (such as sodium, potassium, and chloride) in order to help prevent dehydration.  WHAT GENERAL GUIDELINES DO I NEED TO FOLLOW?  Slowly drink 1 cup (8 oz) of fluid for each episode of diarrhea. If you are getting enough fluid, your urine will be clear or pale yellow.  Eat starchy foods. Some good choices include white rice, white toast, pasta, low-fiber cereal, baked potatoes (without the skin), saltine crackers, and bagels.  Avoid large servings of any cooked vegetables.  Limit fruit to two servings per day. A serving is  cup or 1 small piece.  Choose foods with less than 2 g of fiber per serving.  Limit fats to less than 8 tsp (38 g) per day.  Avoid fried foods.  Eat foods that have probiotics in them. Probiotics can be found in certain dairy products.  Avoid foods and beverages that may increase the speed at which food moves through the stomach and intestines (gastrointestinal tract). Things to avoid include:  High-fiber foods, such as dried fruit, raw fruits and vegetables, nuts, seeds, and whole grain foods.  Spicy foods and high-fat foods.  Foods and beverages sweetened with high-fructose corn syrup, honey, or sugar alcohols such as xylitol, sorbitol, and mannitol. WHAT FOODS ARE RECOMMENDED? Grains White rice. White, [place], or pita breads (fresh or toasted), including plain rolls, buns, or bagels. White pasta. Saltine, soda, or graham crackers. Pretzels. Low-fiber cereal. Cooked cereals made with water (such as cornmeal, farina, or  cream cereals). Plain muffins. Matzo. Melba toast. Zwieback.  Vegetables Potatoes (without the skin). Strained tomato and vegetable juices. Most well-cooked and canned vegetables without seeds. Tender lettuce. Fruits Cooked or canned applesauce, apricots, cherries, fruit cocktail, grapefruit, peaches, pears, or plums. Fresh bananas, apples without skin, cherries, grapes, cantaloupe, grapefruit, peaches, oranges, or plums.  Meat and Other Protein Products Baked or boiled chicken. Eggs. Tofu. Fish. Seafood. Smooth peanut butter. Ground or well-cooked tender beef, ham, veal, lamb, pork, or poultry.  Dairy Plain yogurt, kefir, and unsweetened liquid yogurt. Lactose-free milk, buttermilk, or soy milk. Plain hard cheese. Beverages Sport drinks. Clear broths. Diluted fruit juices (except prune). Regular, caffeine-free sodas such as ginger ale. Water. Decaffeinated teas. Oral rehydration solutions. Sugar-free beverages not sweetened with sugar alcohols. Other Bouillon, broth, or soups made from recommended foods.  The items listed above may not be a complete list of recommended foods or beverages. Contact your dietitian for more options. WHAT FOODS ARE NOT RECOMMENDED? Grains Whole grain, whole wheat, bran, or rye breads, rolls, pastas, crackers, and cereals. Wild or brown rice. Cereals that contain more than 2 g of fiber per serving. Corn tortillas or taco shells. Cooked or dry oatmeal. Granola. Popcorn. Vegetables Raw vegetables. Cabbage, broccoli, Brussels sprouts, artichokes, baked beans,  beet greens, corn, kale, legumes, peas, sweet potatoes, and yams. Potato skins. Cooked spinach and cabbage. Fruits Dried fruit, including raisins and dates. Raw fruits. Stewed or dried prunes. Fresh apples with skin, apricots, mangoes, pears, raspberries, and strawberries.  Meat and Other Protein Products Chunky peanut butter. Nuts and seeds. Beans and lentils. Tomasa BlaseBacon.  Dairy High-fat cheeses. Milk, chocolate  milk, and beverages made with milk, such as milk shakes. Cream. Ice cream. Sweets and Desserts Sweet rolls, doughnuts, and sweet breads. Pancakes and waffles. Fats and Oils Butter. Cream sauces. Margarine. Salad oils. Plain salad dressings. Olives. Avocados.  Beverages Caffeinated beverages (such as coffee, tea, soda, or energy drinks). Alcoholic beverages. Fruit juices with pulp. Prune juice. Soft drinks sweetened with high-fructose corn syrup or sugar alcohols. Other Coconut. Hot sauce. Chili powder. Mayonnaise. Gravy. Cream-based or milk-based soups.  The items listed above may not be a complete list of foods and beverages to avoid. Contact your dietitian for more information. WHAT SHOULD I DO IF I BECOME DEHYDRATED? Diarrhea can sometimes lead to dehydration. Signs of dehydration include dark urine and dry mouth and skin. If you think you are dehydrated, you should rehydrate with an oral rehydration solution. These solutions can be purchased at pharmacies, retail stores, or online.  Drink -1 cup (120-240 mL) of oral rehydration solution each time you have an episode of diarrhea. If drinking this amount makes your diarrhea worse, try drinking smaller amounts more often. For example, drink 1-3 tsp (5-15 mL) every 5-10 minutes.  A general rule for staying hydrated is to drink 1-2 L of fluid per day. Talk to your health care provider about the specific amount you should be drinking each day. Drink enough fluids to keep your urine clear or pale yellow. Document Released: 11/30/2003 Document Revised: 09/14/2013 Document Reviewed: 08/02/2013 [facility] CenterExit[facility] Patient Information 2015 [place]ExitCare, [place]. This information is not intended to replace advice given to you by your health care provider. Make sure you discuss any questions you have with your health care provider.

## 2014-12-13 IMAGING — CT CT MAXILLOFACIAL W/O CM
3 series · 15 of 47 positions shown, 18 images · non-contrast
Comparison: None.

CLINICAL DATA: Status post left orbital trauma.

EXAM:
CT MAXILLOFACIAL WITHOUT CONTRAST
TECHNIQUE: Multidetector CT imaging of the maxillofacial structures was
performed. Multiplanar CT image reconstructions were also generated.
A small metallic BB was placed on the right temple in order to
reliably differentiate right from left.

[Series 2: facial/ orbits 2.0 h30s · axial · 0.36mm/px · z∈[-223,-75]mm · 9 of 86 slices shown, 12 images]
[im 6/86  brain]
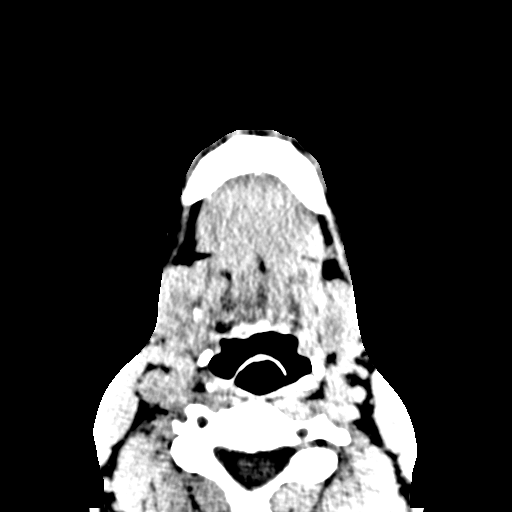
[im 6/86  bone]
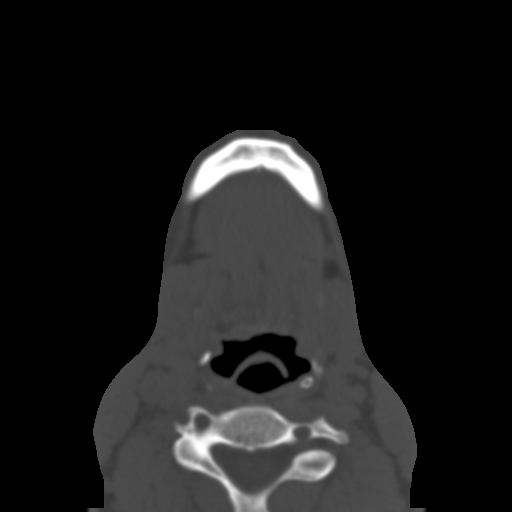
[im 15/86  bone]
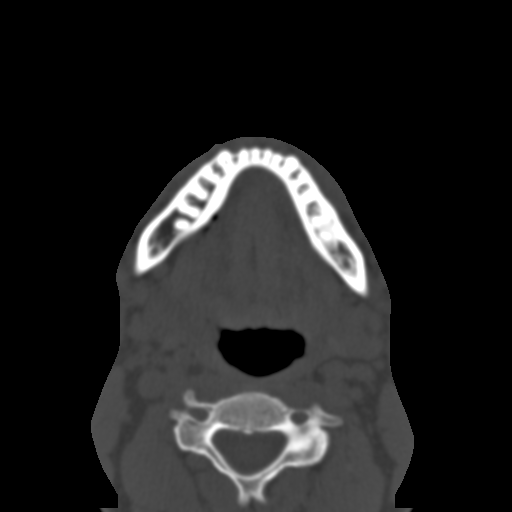
[im 24/86  bone]
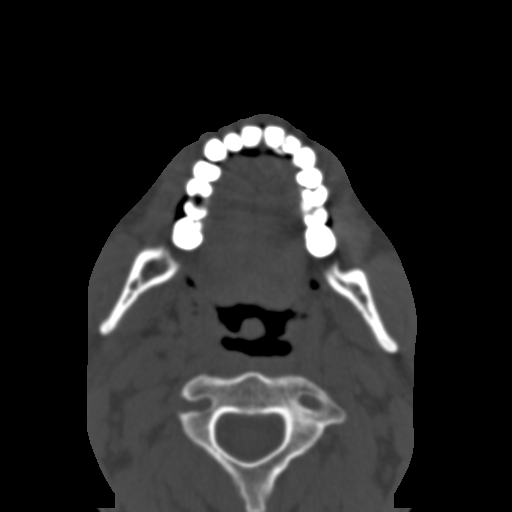
[im 33/86  bone]
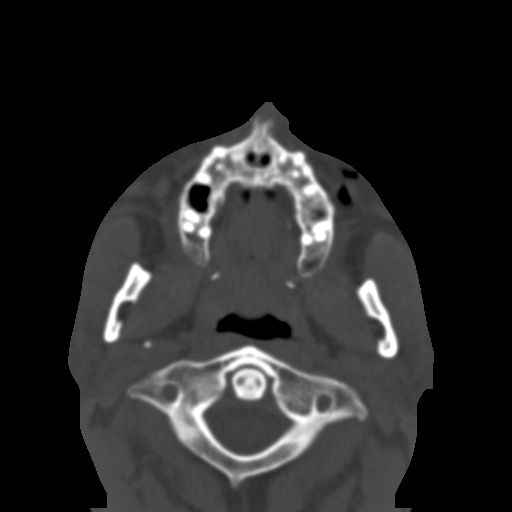
[im 44/86  brain]
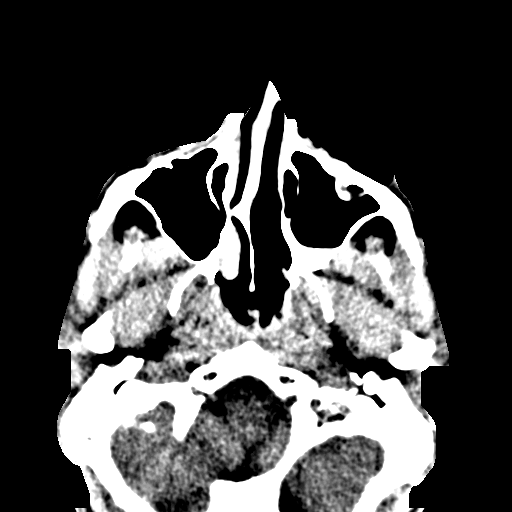
[im 44/86  bone]
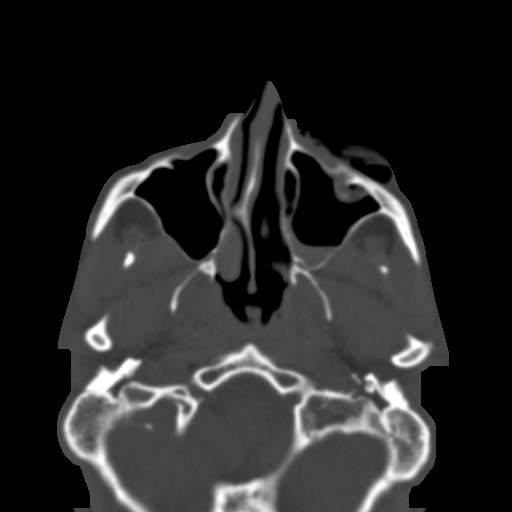
[im 53/86  bone]
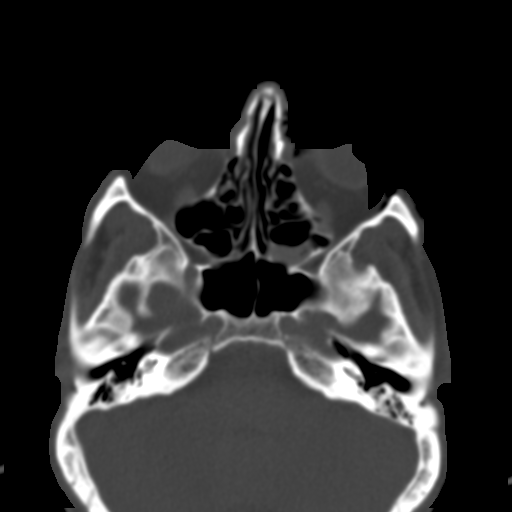
[im 62/86  bone]
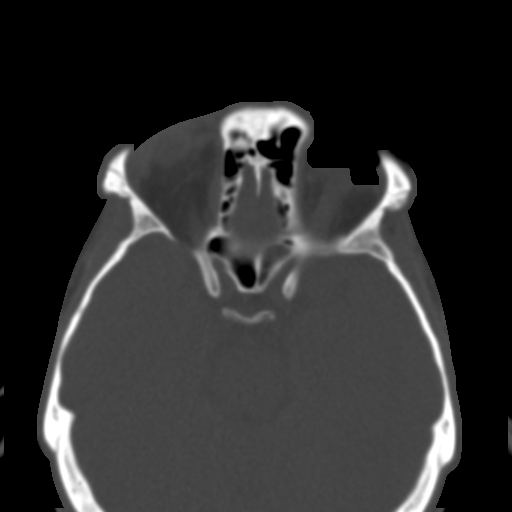
[im 71/86  bone]
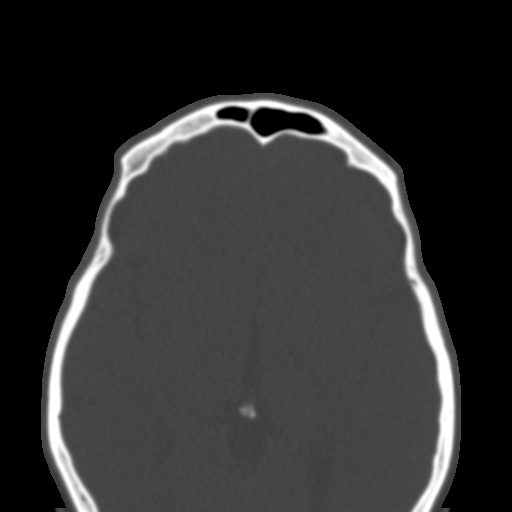
[im 80/86  brain]
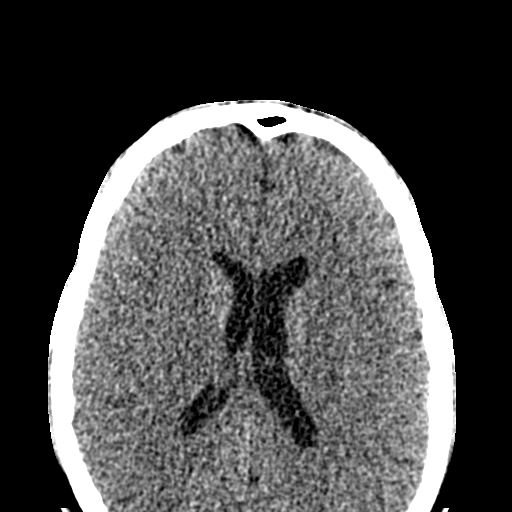
[im 80/86  bone]
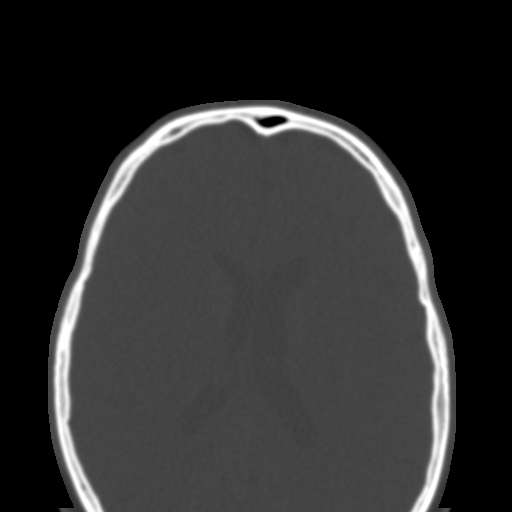

[Series 4: coronal st · coronal · 0.35mm/px · 3 of 80 slices shown]
[im 36/80  bone]
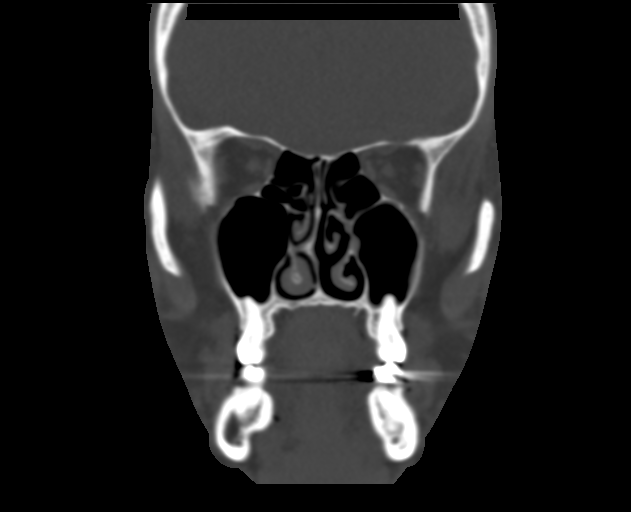
[im 44/80  bone]
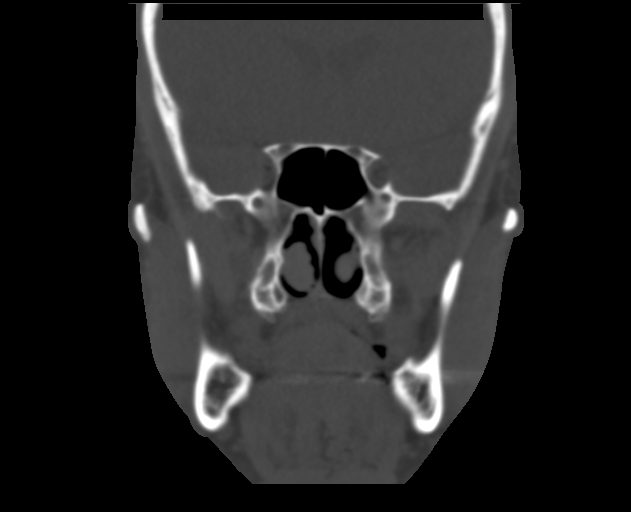
[im 53/80  bone]
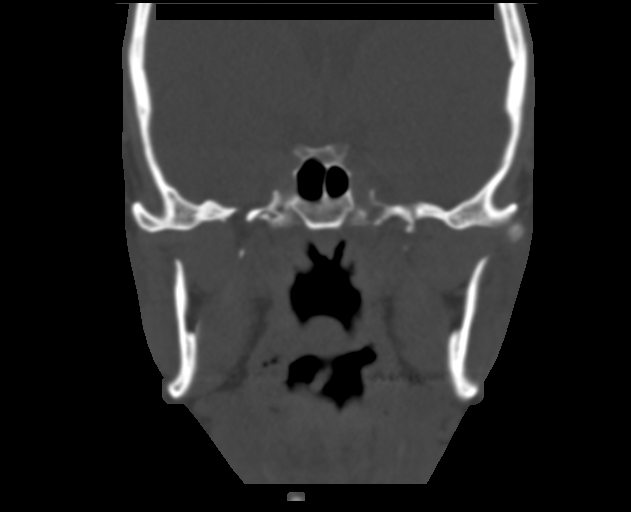

[Series 5: sagittal st · sagittal · 0.40mm/px · 3 of 80 slices shown]
[im 27/80  bone]
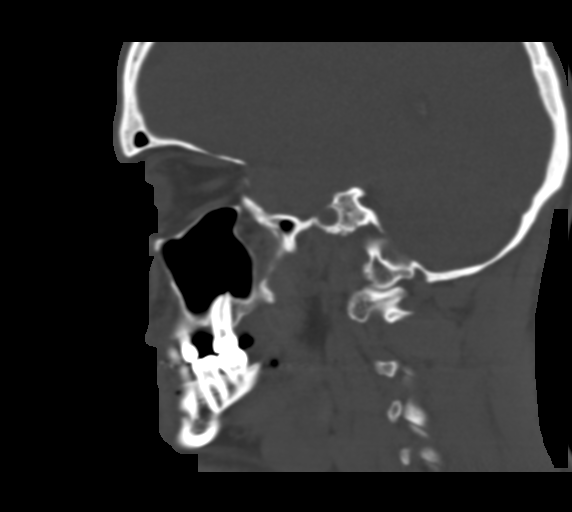
[im 40/80  bone]
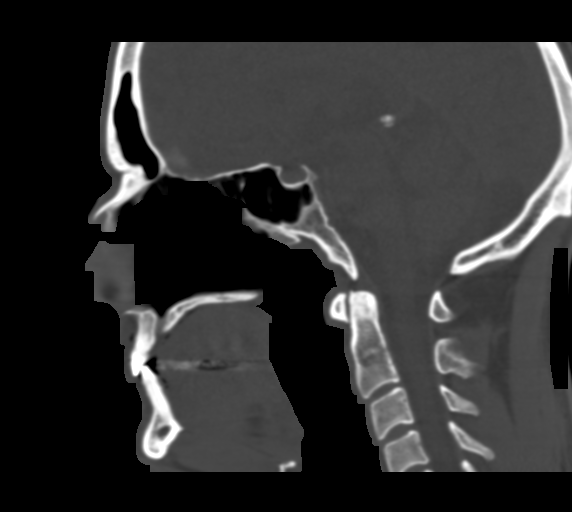
[im 53/80  bone]
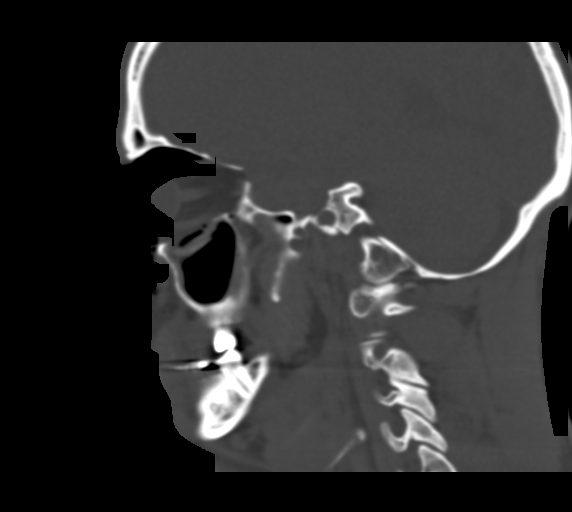

[15 of 47 positions shown; findings below may reference images not displayed]

FINDINGS: There is a left-sided orbital floor blow-out fracture. Depression of
the fracture fragments by approximately 6 mm is present. Fractures
of the medial wall of the orbit are present as well. The other bony
orbital walls are intact. The zygomatic arch is intact. Orbital soft
tissues extend into the superior aspect of the left maxillary sinus
and there is also blood within the left maxillary sinus. The
inferior rectus muscle remains within the confines of the bony
orbit. The left globe is intact. There is pre and postseptal left
orbital gas. Gas extends medially into the subcutaneous tissues of
the left aspect of the nose. There is no definite acute nasal
fracture. There is an air-fluid level in the left maxillary sinus
and there is mucoperiosteal thickening within a posterior ethmoid
sinus cell.

The lateral and medial walls of the left maxillary sinus are intact.
The frontal, sphenoid, and right maxillary and right ethmoid sinuses
are clear.

The right orbital bones and orbital soft tissues are intact. The
bony orbit is intact. The pterygoid plates and temporomandibular
joints are normal. No mandibular fracture is demonstrated. The nasal
passages are patent.
IMPRESSION: 1. There is a left orbital floor blow-out fracture with orbital fat
lying in the superior aspect of the left maxillary sinus. There are
mildly depressed medial orbital wall fractures.
2. The left globe is intact. There is a large amount of preseptal
emphysema with a small amount of postseptal emphysema.
3. There is fluid consistent with acute blood in the left maxillary
sinus.

## 2016-09-28 ENCOUNTER — Encounter: Payer: Self-pay | Admitting: *Deleted

## 2016-09-28 ENCOUNTER — Telehealth: Payer: Self-pay | Admitting: Nurse Practitioner

## 2016-09-28 DIAGNOSIS — R059 Cough, unspecified: Secondary | ICD-10-CM

## 2016-09-28 DIAGNOSIS — R05 Cough: Secondary | ICD-10-CM

## 2016-09-28 MED ORDER — AZITHROMYCIN 250 MG PO TABS: ORAL_TABLET | ORAL | 0 refills | Status: AC

## 2016-09-28 MED ORDER — BENZONATATE 100 MG PO CAPS: 100.0000 mg | ORAL_CAPSULE | Freq: Three times a day (TID) | ORAL | 0 refills | Status: AC | PRN

## 2016-09-28 NOTE — Progress Notes (Signed)

## 2017-06-12 ENCOUNTER — Encounter: Payer: Self-pay | Admitting: Family Medicine
